# Patient Record
Sex: Female | Born: 1970 | Race: White | Hispanic: No | Marital: Married | Smoking: Former smoker
Health system: Southern US, Community
[De-identification: ages and names within clinical notes are randomized; demographics above are authoritative.]

## PROBLEM LIST (undated history)

## (undated) DIAGNOSIS — F319 Bipolar disorder, unspecified: Secondary | ICD-10-CM

## (undated) HISTORY — PX: TUBAL LIGATION: SHX77

---

## 2015-10-18 ENCOUNTER — Emergency Department (HOSPITAL_BASED_OUTPATIENT_CLINIC_OR_DEPARTMENT_OTHER)
Admission: EM | Admit: 2015-10-18 | Discharge: 2015-10-18 | Disposition: A | Discharge status: Home / Self Care | Payer: PRIVATE HEALTH INSURANCE | Attending: Emergency Medicine | Admitting: Emergency Medicine

## 2015-10-18 ENCOUNTER — Encounter (HOSPITAL_BASED_OUTPATIENT_CLINIC_OR_DEPARTMENT_OTHER): Payer: Self-pay

## 2015-10-18 DIAGNOSIS — N390 Urinary tract infection, site not specified: Secondary | ICD-10-CM | POA: Diagnosis not present

## 2015-10-18 DIAGNOSIS — F17200 Nicotine dependence, unspecified, uncomplicated: Secondary | ICD-10-CM | POA: Insufficient documentation

## 2015-10-18 DIAGNOSIS — R300 Dysuria: Secondary | ICD-10-CM | POA: Diagnosis present

## 2015-10-18 DIAGNOSIS — R110 Nausea: Secondary | ICD-10-CM | POA: Diagnosis not present

## 2015-10-18 DIAGNOSIS — Z79899 Other long term (current) drug therapy: Secondary | ICD-10-CM | POA: Diagnosis not present

## 2015-10-18 DIAGNOSIS — F319 Bipolar disorder, unspecified: Secondary | ICD-10-CM | POA: Insufficient documentation

## 2015-10-18 HISTORY — DX: Bipolar disorder, unspecified: F31.9

## 2015-10-18 LAB — URINALYSIS, ROUTINE W REFLEX MICROSCOPIC
BILIRUBIN URINE: NEGATIVE
GLUCOSE, UA: NEGATIVE mg/dL
KETONES UR: NEGATIVE mg/dL
Nitrite: POSITIVE — AB
Protein, ur: 100 mg/dL — AB
Specific Gravity, Urine: 1.015 (ref 1.005–1.030)
pH: 6 (ref 5.0–8.0)

## 2015-10-18 LAB — URINE MICROSCOPIC-ADD ON

## 2015-10-18 LAB — PREGNANCY, URINE: Preg Test, Ur: NEGATIVE

## 2015-10-18 MED ORDER — PHENAZOPYRIDINE HCL 200 MG PO TABS: 200.0000 mg | ORAL_TABLET | Freq: Three times a day (TID) | ORAL | 0 refills | Status: DC

## 2015-10-18 MED ORDER — CIPROFLOXACIN HCL 250 MG PO TABS: 250.0000 mg | ORAL_TABLET | Freq: Two times a day (BID) | ORAL | 0 refills | Status: DC

## 2015-10-18 NOTE — ED Provider Notes (Signed)
[facility] Note   CSN: [id number] Arrival date & time: 10/18/15  2010  First Provider Contact:  First MD Initiated Contact with Patient 10/18/15 2028     By signing my name below, I, Majel Homer, attest that this documentation has been prepared under the direction and in the presence of non-physician practitioner, Elizabeth Sauer, PA-C. Electronically Signed: Majel Homer, Scribe. 10/18/2015. 9:05 PM.  History   Chief Complaint Chief Complaint  Patient presents with  . Dysuria   HPI Comments: Lori Oneal is a 45 y.o. female who presents to the Emergency Department complaining of gradually worsening, dysuria and left flank pain that began 3 days ago and acutely worsened today.  Pt suspects she has a UTI; she notes she has never had similar symptoms before. Pt also reports suprapubic abdominal pain. She denies fever and vomiting. She states associated nausea and chills. She notes she contacted her PCP this morning about her symptoms; however, she did not hear back from her which prompted her to come to ED today. She states her next appointment is in ~2 months.   The history is provided by the patient. No language interpreter was used.   Past Medical History:  Diagnosis Date  . Bipolar disorder (HCC)     There are no active problems to display for this patient.   Past Surgical History:  Procedure Laterality Date  . TUBAL LIGATION      OB History    No data available       Home Medications    Prior to Admission medications   Medication Sig Start Date End Date Taking? Authorizing Provider  Conj Estrog-Medroxyprogest Ace (PREMPRO PO) Take by mouth.   Yes Historical Provider, MD  Divalproex Sodium (DEPAKOTE PO) Take by mouth.   Yes Historical Provider, MD  ciprofloxacin (CIPRO) 250 MG tablet Take 1 tablet (250 mg total) by mouth every 12 (twelve) hours. 10/18/15   Chase Picket Harriett Azar, PA-C  phenazopyridine (PYRIDIUM) 200 MG tablet Take 1 tablet (200 mg total) by  mouth 3 (three) times daily. 10/18/15   Chase Picket Chadley Dziedzic, PA-C    Family History No family history on file.  Social History Social History  Substance Use Topics  . Smoking status: Current Every Day Smoker  . Smokeless tobacco: Never Used  . Alcohol use No     Allergies   Review of patient's allergies indicates no known allergies.   Review of Systems Review of Systems  Constitutional: Negative for fever.  Gastrointestinal: Positive for abdominal pain and nausea. Negative for vomiting.  Genitourinary: Positive for dysuria and flank pain (left).   Physical Exam Updated Vital Signs BP 115/80 (BP Location: Left Arm)   Pulse 73   Temp 99.2 F (37.3 C) (Oral)   Resp 18   Ht  (1.549 m)   Wt 124 lb (56.2 kg)   SpO2 100%   BMI 23.43 kg/m   Physical Exam  Constitutional: She is oriented to person, place, and time. She appears well-developed and well-nourished.  HENT:  Head: Normocephalic and atraumatic.  Eyes: Conjunctivae are normal. Pupils are equal, round, and reactive to light. Right eye exhibits no discharge. Left eye exhibits no discharge. No scleral icterus.  Neck: Normal range of motion. No JVD present. No tracheal deviation present.  Cardiovascular: Normal rate and regular rhythm.   Pulmonary/Chest: Effort normal and breath sounds normal. No stridor.  Abdominal: There is tenderness.  Suprapubic tenderness  Left CVA tenderness   Neurological: She is alert  and oriented to person, place, and time. Coordination normal.  Psychiatric: She has a normal mood and affect. Her behavior is normal. Judgment and thought content normal.  Nursing note and vitals reviewed.  ED Treatments / Results  Labs (all labs ordered are listed, but only abnormal results are displayed) Labs Reviewed  URINALYSIS, ROUTINE W REFLEX MICROSCOPIC (NOT AT [facility]) - Abnormal; Notable for the following:       Result Value   APPearance CLOUDY (*)    Hgb urine dipstick LARGE (*)    Protein, ur  100 (*)    Nitrite POSITIVE (*)    Leukocytes, UA MODERATE (*)    All other components within normal limits  URINE MICROSCOPIC-ADD ON - Abnormal; Notable for the following:    Squamous Epithelial / LPF 0-5 (*)    Bacteria, UA MANY (*)    All other components within normal limits  URINE CULTURE  PREGNANCY, URINE    EKG  EKG Interpretation None       Radiology No results found.  Procedures Procedures  DIAGNOSTIC STUDIES:  Oxygen Saturation is 100% on RA, normal by my interpretation.    COORDINATION OF CARE:  9:02 PM Discussed treatment plan with pt at bedside and pt agreed to plan.  Medications Ordered in ED Medications - No data to display   Initial Impression / Assessment and Plan / ED Course  I have reviewed the triage vital signs and the nursing notes.  Pertinent labs & imaging results that were available during my care of the patient were reviewed by me and considered in my medical decision making (see chart for details).  Clinical Course   Lori Oneal presents to ED for dysuria and left flank pain. UA shows moderate leuks positive nitrite and too numerous to count white blood cells. She is well appearing, afebrile and hemodynamically stable on exam. She does have some left CVA tenderness. Will treat with Cipro. Informed her of reasons to return to ED including vomiting or fevers. If symptoms are not improved or worsen in 3 days, follow up with PCP or return to ED for recheck. PCP follow up in 1 week. Rx for Pyridium given. Urine sent for culture.  Final Clinical Impressions(s) / ED Diagnoses   Final diagnoses:  UTI (lower urinary tract infection)    New Prescriptions Discharge Medication List as of 10/18/2015  9:24 PM    START taking these medications   Details  ciprofloxacin (CIPRO) 250 MG tablet Take 1 tablet (250 mg total) by mouth every 12 (twelve) hours., Starting Mon 10/18/2015, Print    phenazopyridine (PYRIDIUM) 200 MG tablet Take 1 tablet (200 mg  total) by mouth 3 (three) times daily., Starting Mon 10/18/2015, Print       I personally performed the services described in this documentation, which was scribed in my presence. The recorded information has been reviewed and is accurate.     [facility] Larkin Alfred, PA-C 10/19/15 8003    Arby Barrette, MD 10/19/15 [phone]

## 2015-10-18 NOTE — Discharge Instructions (Signed)
Stay very well hydrated with plenty of water throughout the day. Please take antibiotic until completion. Use pyridium as directed to decrease painful urination but know that a common side effect is to turn your urine a bright orange/red color. This is not a harmful side effect. Follow up with primary care physician in 1 week for recheck of ongoing symptoms. ° °Please seek immediate care if you develop the following: °Your symptoms are no better or worse in 3 days. °There is severe back pain or lower abdominal pain.  °You develop chills.  °You have a fever.  °There is nausea or vomiting.  °There is continued burning or discomfort with urination.  °

## 2015-10-18 NOTE — ED Triage Notes (Signed)
C/o dysuria and left flank pain since Saturday-NAD-steady gait

## 2015-10-21 LAB — URINE CULTURE: Culture: >=100000 — AB

## 2015-10-22 ENCOUNTER — Telehealth (HOSPITAL_BASED_OUTPATIENT_CLINIC_OR_DEPARTMENT_OTHER): Payer: Self-pay | Admitting: *Deleted

## 2015-10-22 NOTE — Telephone Encounter (Signed)
Positive urine culture treated with ciprofloxacin no change needed per Tennis Must PharmD.

## 2016-09-13 ENCOUNTER — Encounter (HOSPITAL_BASED_OUTPATIENT_CLINIC_OR_DEPARTMENT_OTHER): Payer: Self-pay | Admitting: Emergency Medicine

## 2016-09-13 ENCOUNTER — Emergency Department (HOSPITAL_BASED_OUTPATIENT_CLINIC_OR_DEPARTMENT_OTHER): Payer: PRIVATE HEALTH INSURANCE

## 2016-09-13 ENCOUNTER — Inpatient Hospital Stay (HOSPITAL_BASED_OUTPATIENT_CLINIC_OR_DEPARTMENT_OTHER)
Admission: EM | Admit: 2016-09-13 | Discharge: 2016-09-16 | DRG: 690 | Disposition: A | Discharge status: Home / Self Care | Payer: PRIVATE HEALTH INSURANCE | Attending: Internal Medicine | Admitting: Internal Medicine

## 2016-09-13 DIAGNOSIS — N10 Acute tubulo-interstitial nephritis: Secondary | ICD-10-CM | POA: Diagnosis not present

## 2016-09-13 DIAGNOSIS — R1031 Right lower quadrant pain: Secondary | ICD-10-CM | POA: Diagnosis not present

## 2016-09-13 DIAGNOSIS — F319 Bipolar disorder, unspecified: Secondary | ICD-10-CM | POA: Diagnosis present

## 2016-09-13 DIAGNOSIS — E871 Hypo-osmolality and hyponatremia: Secondary | ICD-10-CM | POA: Diagnosis present

## 2016-09-13 DIAGNOSIS — Z833 Family history of diabetes mellitus: Secondary | ICD-10-CM

## 2016-09-13 DIAGNOSIS — F17200 Nicotine dependence, unspecified, uncomplicated: Secondary | ICD-10-CM | POA: Diagnosis present

## 2016-09-13 DIAGNOSIS — B9620 Unspecified Escherichia coli [E. coli] as the cause of diseases classified elsewhere: Secondary | ICD-10-CM | POA: Diagnosis present

## 2016-09-13 DIAGNOSIS — N12 Tubulo-interstitial nephritis, not specified as acute or chronic: Secondary | ICD-10-CM | POA: Diagnosis not present

## 2016-09-13 DIAGNOSIS — R7881 Bacteremia: Secondary | ICD-10-CM | POA: Diagnosis present

## 2016-09-13 DIAGNOSIS — R112 Nausea with vomiting, unspecified: Secondary | ICD-10-CM | POA: Diagnosis present

## 2016-09-13 DIAGNOSIS — E876 Hypokalemia: Secondary | ICD-10-CM | POA: Diagnosis present

## 2016-09-13 DIAGNOSIS — Z825 Family history of asthma and other chronic lower respiratory diseases: Secondary | ICD-10-CM

## 2016-09-13 LAB — COMPREHENSIVE METABOLIC PANEL
ALK PHOS: 52 U/L (ref 38–126)
ALT: 15 U/L (ref 14–54)
ANION GAP: 11 (ref 5–15)
AST: 22 U/L (ref 15–41)
Albumin: 4.1 g/dL (ref 3.5–5.0)
BILIRUBIN TOTAL: 1 mg/dL (ref 0.3–1.2)
BUN: 13 mg/dL (ref 6–20)
CALCIUM: 9.1 mg/dL (ref 8.9–10.3)
CO2: 23 mmol/L (ref 22–32)
CREATININE: 0.83 mg/dL (ref 0.44–1.00)
Chloride: 100 mmol/L — ABNORMAL LOW (ref 101–111)
GFR calc non Af Amer: >60 mL/min (ref >60)
GLUCOSE: 119 mg/dL — AB (ref 65–99)
Potassium: 3.5 mmol/L (ref 3.5–5.1)
SODIUM: 134 mmol/L — AB (ref 135–145)
TOTAL PROTEIN: 8.1 g/dL (ref 6.5–8.1)

## 2016-09-13 LAB — URINALYSIS, ROUTINE W REFLEX MICROSCOPIC
BILIRUBIN URINE: NEGATIVE
GLUCOSE, UA: NEGATIVE mg/dL
KETONES UR: 15 mg/dL — AB
Nitrite: POSITIVE — AB
PH: 7.5 (ref 5.0–8.0)
PROTEIN: NEGATIVE mg/dL
Specific Gravity, Urine: 1.012 (ref 1.005–1.030)

## 2016-09-13 LAB — URINALYSIS, MICROSCOPIC (REFLEX)

## 2016-09-13 LAB — PROTIME-INR
INR: 1.15
PROTHROMBIN TIME: 14.8 s (ref 11.4–15.2)

## 2016-09-13 LAB — CBC WITH DIFFERENTIAL/PLATELET
BASOS PCT: 0 %
Basophils Absolute: 0 10*3/uL (ref 0.0–0.1)
EOS ABS: 0 10*3/uL (ref 0.0–0.7)
EOS PCT: 0 %
HCT: 40.2 % (ref 36.0–46.0)
Hemoglobin: 13.8 g/dL (ref 12.0–15.0)
LYMPHS ABS: 0.6 10*3/uL — AB (ref 0.7–4.0)
Lymphocytes Relative: 5 %
MCH: 29.8 pg (ref 26.0–34.0)
MCHC: 34.3 g/dL (ref 30.0–36.0)
MCV: 86.8 fL (ref 78.0–100.0)
MONOS PCT: 12 %
Monocytes Absolute: 1.5 10*3/uL — ABNORMAL HIGH (ref 0.1–1.0)
NEUTROS PCT: 83 %
Neutro Abs: 9.9 10*3/uL — ABNORMAL HIGH (ref 1.7–7.7)
Platelets: 313 10*3/uL (ref 150–400)
RBC: 4.63 MIL/uL (ref 3.87–5.11)
RDW: 13.7 % (ref 11.5–15.5)
WBC: 11.9 10*3/uL — AB (ref 4.0–10.5)

## 2016-09-13 LAB — I-STAT CG4 LACTIC ACID, ED
LACTIC ACID, VENOUS: 0.43 mmol/L — AB (ref 0.5–1.9)
Lactic Acid, Venous: 1.48 mmol/L (ref 0.5–1.9)

## 2016-09-13 LAB — PREGNANCY, URINE: Preg Test, Ur: NEGATIVE

## 2016-09-13 MED ORDER — ACETAMINOPHEN 650 MG RE SUPP: 650.0000 mg | Freq: Four times a day (QID) | RECTAL | Status: DC | PRN

## 2016-09-13 MED ORDER — SODIUM CHLORIDE 0.9 % IV BOLUS (SEPSIS)
1000.0000 mL | Freq: Once | INTRAVENOUS | Status: AC
  Administered 2016-09-13: 1000 mL via INTRAVENOUS

## 2016-09-13 MED ORDER — ENOXAPARIN SODIUM 40 MG/0.4ML ~~LOC~~ SOLN
40.0000 mg | Freq: Every day | SUBCUTANEOUS | Status: DC
  Administered 2016-09-14 – 2016-09-15 (×2): 40 mg via SUBCUTANEOUS
  Filled 2016-09-13 (×2): qty 0.4

## 2016-09-13 MED ORDER — DEXTROSE 5 % IV SOLN
1.0000 g | Freq: Once | INTRAVENOUS | Status: AC
  Administered 2016-09-13: 1 g via INTRAVENOUS
  Filled 2016-09-13: qty 10

## 2016-09-13 MED ORDER — IBUPROFEN 200 MG PO TABS
400.0000 mg | ORAL_TABLET | Freq: Four times a day (QID) | ORAL | Status: DC | PRN
  Administered 2016-09-14: 600 mg via ORAL
  Filled 2016-09-13: qty 3

## 2016-09-13 MED ORDER — SODIUM CHLORIDE 0.9 % IV BOLUS (SEPSIS)
1000.0000 mL | Freq: Once | INTRAVENOUS | Status: AC
Start: 2016-09-13 — End: 2016-09-13
  Administered 2016-09-13: 1000 mL via INTRAVENOUS

## 2016-09-13 MED ORDER — MORPHINE SULFATE (PF) 2 MG/ML IV SOLN
2.0000 mg | Freq: Once | INTRAVENOUS | Status: AC
  Administered 2016-09-13: 2 mg via INTRAVENOUS
  Filled 2016-09-13: qty 1

## 2016-09-13 MED ORDER — IOPAMIDOL (ISOVUE-300) INJECTION 61%
100.0000 mL | Freq: Once | INTRAVENOUS | Status: AC | PRN
  Administered 2016-09-13: 100 mL via INTRAVENOUS

## 2016-09-13 MED ORDER — OXYCODONE HCL 5 MG PO TABS
5.0000 mg | ORAL_TABLET | Freq: Four times a day (QID) | ORAL | Status: DC | PRN
  Administered 2016-09-14 – 2016-09-15 (×3): 5 mg via ORAL
  Filled 2016-09-13 (×3): qty 1

## 2016-09-13 MED ORDER — DEXTROSE 5 % IV SOLN: 1.0000 g | INTRAVENOUS | Status: DC

## 2016-09-13 MED ORDER — ONDANSETRON HCL 4 MG/2ML IJ SOLN
4.0000 mg | Freq: Once | INTRAMUSCULAR | Status: AC
  Administered 2016-09-13: 4 mg via INTRAVENOUS
  Filled 2016-09-13: qty 2

## 2016-09-13 MED ORDER — ONDANSETRON HCL 4 MG/2ML IJ SOLN
4.0000 mg | Freq: Four times a day (QID) | INTRAMUSCULAR | Status: DC | PRN
  Administered 2016-09-14 (×2): 4 mg via INTRAVENOUS
  Filled 2016-09-13 (×2): qty 2

## 2016-09-13 MED ORDER — PANTOPRAZOLE SODIUM 40 MG PO TBEC
40.0000 mg | DELAYED_RELEASE_TABLET | Freq: Every day | ORAL | Status: DC
  Administered 2016-09-14 – 2016-09-16 (×3): 40 mg via ORAL
  Filled 2016-09-13 (×3): qty 1

## 2016-09-13 MED ORDER — ACETAMINOPHEN 500 MG PO TABS
ORAL_TABLET | ORAL | Status: AC
  Administered 2016-09-13: 1000 mg
  Filled 2016-09-13: qty 2

## 2016-09-13 MED ORDER — ACETAMINOPHEN 325 MG PO TABS
650.0000 mg | ORAL_TABLET | Freq: Four times a day (QID) | ORAL | Status: DC | PRN
  Administered 2016-09-14 – 2016-09-16 (×5): 650 mg via ORAL
  Filled 2016-09-13 (×5): qty 2

## 2016-09-13 MED ORDER — ONDANSETRON HCL 4 MG PO TABS: 4.0000 mg | ORAL_TABLET | Freq: Four times a day (QID) | ORAL | Status: DC | PRN

## 2016-09-13 NOTE — ED Triage Notes (Signed)
Patient states that she thinks she has a UTI  - has had symptoms for 2 weeks. Patient called her MD and he gave her Rx for amoxicillin. Patient is now having back pain, fever, chills and aches

## 2016-09-13 NOTE — Progress Notes (Signed)
Pt. Has arrived to the unit via [facility]. She is alert and oriented. Denies c/o pain is able to walk unassisted and is not in any acute distress. No family @ bedside @ this time.

## 2016-09-13 NOTE — ED Notes (Signed)
ED Provider at bedside. 

## 2016-09-13 NOTE — ED Notes (Signed)
Patient transported to CT 

## 2016-09-13 NOTE — ED Provider Notes (Signed)
[facility]   CSN: [id number] Arrival date & time: 09/13/16  1527     History   Chief Complaint Chief Complaint  Patient presents with  . Back Pain    HPI Lori Oneal is a 46 y.o. female.  HPI   UTI a few weeks ago, took over the counter medications and it came. Felt dysuria, frequency and back pain on the right side all the way across.  Having chills. Was given amoxicillin by PCP but has not been able to keep it down. Started it on Tuesday.  Throwing up at least 3 times per day.  Urinating frequently. No diarrhea.  Lower abdominal pain and right sided pain.  Flank pain is severe.   Past Medical History:  Diagnosis Date  . Bipolar disorder [facility])     Patient Active Problem List   Diagnosis Date Noted  . Acute pyelonephritis 09/13/2016    Past Surgical History:  Procedure Laterality Date  . TUBAL LIGATION      OB History    No data available       Home Medications    Prior to Admission medications   Medication Sig Start Date End Date Taking? Authorizing Provider  ciprofloxacin (CIPRO) 250 MG tablet Take 1 tablet (250 mg total) by mouth every 12 (twelve) hours. 10/18/15   Ward, Chase Picket, PA-C  Conj Estrog-Medroxyprogest Ace (PREMPRO PO) Take by mouth.    [provider]  Divalproex Sodium (DEPAKOTE PO) Take by mouth.    [provider]  phenazopyridine (PYRIDIUM) 200 MG tablet Take 1 tablet (200 mg total) by mouth 3 (three) times daily. 10/18/15   Ward, Chase Picket, PA-C    Family History History reviewed. No pertinent family history.  Social History Social History  Substance Use Topics  . Smoking status: Current Every Day Smoker  . Smokeless tobacco: Never Used  . Alcohol use No     Allergies   Patient has no known allergies.   Review of Systems Review of Systems  Constitutional: Positive for chills, fatigue and fever.  HENT: Negative for sore throat.   Eyes: Negative for visual disturbance.    Respiratory: Negative for cough and shortness of breath.   Cardiovascular: Negative for chest pain.  Gastrointestinal: Positive for abdominal pain (lower abdomen/suprapubic and right), nausea and vomiting. Negative for constipation and diarrhea.  Genitourinary: Positive for dysuria, flank pain, frequency and urgency.  Musculoskeletal: Negative for back pain and neck pain.  Skin: Negative for rash.  Neurological: Negative for syncope and headaches.     Physical Exam Updated Vital Signs BP (!) 102/59   Pulse 66   Temp 98.5 F (36.9 C) (Oral)   Resp 18   Ht 5\' 1"  (1.549 m)   Wt 55.3 kg (122 lb)   SpO2 100%   BMI 23.05 kg/m   Physical Exam  Constitutional: She is oriented to person, place, and time. She appears well-developed and well-nourished. No distress.  HENT:  Head: Normocephalic and atraumatic.  Eyes: Conjunctivae and EOM are normal.  Neck: Normal range of motion.  Cardiovascular: Normal rate, regular rhythm, normal heart sounds and intact distal pulses.  Exam reveals no gallop and no friction rub.   No murmur heard. Pulmonary/Chest: Effort normal and breath sounds normal. No respiratory distress. She has no wheezes. She has no rales.  Abdominal: Soft. She exhibits no distension. There is tenderness in the right upper quadrant, right lower quadrant and suprapubic area. There is CVA tenderness (bilateral) and tenderness  at McBurney's point. There is no guarding and negative Murphy's sign.  Musculoskeletal: She exhibits no edema or tenderness.  Neurological: She is alert and oriented to person, place, and time.  Skin: Skin is warm and dry. No rash noted. She is not diaphoretic. No erythema.  Nursing note and vitals reviewed.    ED Treatments / Results  Labs (all labs ordered are listed, but only abnormal results are displayed) Labs Reviewed  COMPREHENSIVE METABOLIC PANEL - Abnormal; Notable for the following:       Result Value   Sodium 134 (*)    Chloride 100 (*)     Glucose, Bld 119 (*)    All other components within normal limits  CBC WITH DIFFERENTIAL/PLATELET - Abnormal; Notable for the following:    WBC 11.9 (*)    Neutro Abs 9.9 (*)    Lymphs Abs 0.6 (*)    Monocytes Absolute 1.5 (*)    All other components within normal limits  URINALYSIS, ROUTINE W REFLEX MICROSCOPIC - Abnormal; Notable for the following:    APPearance CLOUDY (*)    Hgb urine dipstick TRACE (*)    Ketones, ur 15 (*)    Nitrite POSITIVE (*)    Leukocytes, UA LARGE (*)    All other components within normal limits  URINALYSIS, MICROSCOPIC (REFLEX) - Abnormal; Notable for the following:    Bacteria, UA FEW (*)    Squamous Epithelial / LPF 0-5 (*)    All other components within normal limits  I-STAT CG4 LACTIC ACID, ED - Abnormal; Notable for the following:    Lactic Acid, Venous 0.43 (*)    All other components within normal limits  CULTURE, BLOOD (ROUTINE X 2)  CULTURE, BLOOD (ROUTINE X 2)  URINE CULTURE  PROTIME-INR  PREGNANCY, URINE  I-STAT CG4 LACTIC ACID, ED    EKG  EKG Interpretation None       Radiology Dg Chest 2 View  Result Date: 09/13/2016 CLINICAL DATA:  46 year old female with fever and chills. Possible sepsis. EXAM: CHEST  2 VIEW COMPARISON:  None. FINDINGS: The heart size and mediastinal contours are within normal limits. Both lungs are clear. The visualized skeletal structures are unremarkable. IMPRESSION: No active cardiopulmonary disease. Electronically Signed   By: Elgie CollardArash  Radparvar M.D.   On: 09/13/2016 18:41   Ct Abdomen Pelvis W Contrast  Result Date: 09/13/2016 CLINICAL DATA:  Acute onset of right flank pain and fever. Recently diagnosed with urinary tract infection. Initial encounter. EXAM: CT ABDOMEN AND PELVIS WITH CONTRAST TECHNIQUE: Multidetector CT imaging of the abdomen and pelvis was performed using the standard protocol following bolus administration of intravenous contrast. CONTRAST:  100mL ISOVUE-300 IOPAMIDOL (ISOVUE-300)  INJECTION 61% COMPARISON:  None. FINDINGS: Lower chest: The visualized lung bases are grossly clear. The visualized portions of the mediastinum are unremarkable. Hepatobiliary: The liver is unremarkable in appearance. The gallbladder is unremarkable in appearance. The common bile duct remains normal in caliber. Pancreas: The pancreas is within normal limits. Spleen: The spleen is unremarkable in appearance. Adrenals/Urinary Tract: The adrenal glands are unremarkable in appearance. Multiple foci of decreased enhancement are noted at the periphery of the right kidney, compatible with multifocal right-sided pyelonephritis. Surrounding perinephric stranding is noted. There is wall thickening along the course of the right ureter and at the left mid ureter, compatible with bilateral ureteritis. There is no evidence of significant hydronephrosis. No renal or ureteral stones are identified. A left renal cyst is noted. Stomach/Bowel: The stomach is unremarkable in appearance. The small  bowel is within normal limits. The appendix is normal in caliber, without evidence of appendicitis. The colon is unremarkable in appearance. Vascular/Lymphatic: The abdominal aorta is unremarkable in appearance. The inferior vena cava is grossly unremarkable. No retroperitoneal lymphadenopathy is seen. No pelvic sidewall lymphadenopathy is identified. Reproductive: The bladder is mildly distended and grossly unremarkable. The uterus is unremarkable in appearance. The ovaries are symmetric and grossly unremarkable in appearance. No suspicious adnexal masses are seen. Other: No additional soft tissue abnormalities are seen. Musculoskeletal: No acute osseous abnormalities are identified. The visualized musculature is unremarkable in appearance. IMPRESSION: 1. Multifocal right-sided pyelonephritis noted. Wall thickening along the courses of both ureters, compatible with bilateral ureteritis. 2. Small left renal cyst noted. Electronically Signed    By: Roanna Raider M.D.   On: 09/13/2016 18:27    Procedures Procedures (including critical care time)  Medications Ordered in ED Medications  sodium chloride 0.9 % bolus 1,000 mL (0 mLs Intravenous Stopped 09/13/16 1709)  sodium chloride 0.9 % bolus 1,000 mL (0 mLs Intravenous Stopped 09/13/16 1908)  cefTRIAXone (ROCEPHIN) 1 g in dextrose 5 % 50 mL IVPB (0 g Intravenous Stopped 09/13/16 1708)  ondansetron (ZOFRAN) injection 4 mg (4 mg Intravenous Given 09/13/16 1615)  morphine 2 MG/ML injection 2 mg (2 mg Intravenous Given 09/13/16 1615)  acetaminophen (TYLENOL) 500 MG tablet (1,000 mg  Given 09/13/16 1615)  iopamidol (ISOVUE-300) 61 % injection 100 mL (100 mLs Intravenous Contrast Given 09/13/16 1800)  sodium chloride 0.9 % bolus 1,000 mL (1,000 mLs Intravenous New Bag/Given 09/13/16 1933)  sodium chloride 0.9 % bolus 1,000 mL (1,000 mLs Intravenous New Bag/Given 09/13/16 1933)  ondansetron (ZOFRAN) injection 4 mg (4 mg Intravenous Given 09/13/16 1926)     Initial Impression / Assessment and Plan / ED Course  I have reviewed the triage vital signs and the nursing notes.  Pertinent labs & imaging results that were available during my care of the patient were reviewed by me and considered in my medical decision making (see chart for details).     46 year old female with a history of bipolar disorder presents with concern for nausea, vomiting, fever, urinary symptoms, abdominal and flank pain.  Symptoms concerning for pyelonephritis. Patient febrile and tachypnea, arrival to the emergency department. Blood cultures, lactic acid, 2 L of normal saline were ordered as well as Rocephin for presumed UTI.  Lactic acid returned within normal limits.  Patient with vital signs improved with fluids and decrease in temperature.   CT ordered to evaluate for other complicating factors, rule out abscess, nephrolithiasis, appendicitis or other.  CT shows right sided pyelonephritis, bilateral ureteral  involvement.   Patient able to tolerate po in ED, feels improved, however blood pressures noted to be fluctuating and she has lightheadedness when standing.  BP 90s systolic when I am in room, suspect accurate BP, a few noted in 80s but not sure if these values were accurate.  Will given 2 more L of NS, admit for observation.   Final Clinical Impressions(s) / ED Diagnoses   Final diagnoses:  Pyelonephritis    New Prescriptions New Prescriptions   No medications on file     Alvira Monday, MD 09/13/16 2005

## 2016-09-13 NOTE — H&P (Signed)
History and Physical  Patient Name: Lori Oneal     ZOX:[id number]    DOB: [birth year]    DOA: 09/13/2016 PCP: System, Pcp Not In  Patient coming from: Home --> [facility]  Chief Complaint: Nausea, fever, flank pain      HPI: Lori Oneal is a 46 y.o. female with no significant past medical history who presents with dysuria and fever, flank pain, vomiting.  Patient was in his usual state of health until about 2 weeks ago when she started to have typical UTI symptoms of dysuria and urinary frequency. She called her PCP who did not obtain a culture but did call in Amoxicillin over the phone which she has been taking. Then in the last day or 2 she has had new onset fever, nausea with vomting, right flank pain and malaise so she came to the ER.  ED course: -Afebrile, heart rate 66, respiration and pulse ox normal, BP 102/59 -Na 134, K 3.5, Cr 0.83, WBC 11.9K, Hgb 13.8 -Lactate 1.48 -Urinalysis with pyuria -She was given 4L NS because of her BP in the 90s and 100s and ceftriaxone IV and TRH were asked to observe for pyelonephritis       ROS: Review of Systems  Constitutional: Positive for chills, fever and malaise/fatigue.  Gastrointestinal: Positive for abdominal pain, nausea and vomiting.  Genitourinary: Positive for dysuria, flank pain and frequency.  All other systems reviewed and are negative.         Past Medical History:  Diagnosis Date  . Bipolar disorder [facility](HCC)     Past Surgical History:  Procedure Laterality Date  . TUBAL LIGATION      Social History: Patient lives with her husband.  The patient walks unassisted.  Nonsmoker.  She is a Geneticist, molecularfurniture fabricator [place]  Rare alcohol.  No Known Allergies  Family history: family history includes COPD in her mother; Diabetes in her other.  Prior to Admission medications   Medication Sig Start Date End Date Taking? Authorizing Provider  estrogen, conjugated,-medroxyprogesterone (PREMPRO) 0.625-2.5 MG tablet Take 1  tablet by mouth daily.   Yes [provider]  omeprazole (PRILOSEC) 40 MG capsule Take 40 mg by mouth daily.   Yes [provider]       Physical Exam: BP 93/64 (BP Location: Right Arm)   Pulse 65   Temp 98.3 F (36.8 C) (Oral)   Resp 18   Ht 5\' 1"  (1.549 m)   Wt 55.3 kg (122 lb)   SpO2 97%   BMI 23.05 kg/m  General appearance: Well-developed, adult female, alert and in no acute distress.   Eyes: Anicteric, conjunctiva pink, lids and lashes normal. PERRL.    ENT: No nasal deformity, discharge, epistaxis.  Hearing normal. OP moist without lesions.   Skin: Warm and dry.  No jaundice.  No suspicious rashes or lesions. Cardiac: RRR, nl S1-S2, no murmurs appreciated.  Capillary refill is brisk.  JVP normal.  No LE edema.  Radial and DP pulses 2+ and symmetric. Respiratory: Normal respiratory rate and rhythm.  CTAB without rales or wheezes. Abdomen: Abdomen soft.  Mild R sided TTP without rebound.  Right sided CVA tenderness. No ascites, distension, hepatosplenomegaly.   MSK: No deformities or effusions.  No cyanosis or clubbing. Neuro: Cranial nerves normal.  Sensation intact to light touch. Speech is fluent.  Muscle strength normal.    Psych: Sensorium intact and responding to questions, attention normal.  Behavior appropriate.  Affect normal.  Judgment and insight appear normal.  Labs on Admission:  I have personally reviewed following labs and imaging studies: CBC:  Recent Labs Lab 09/13/16 1545  WBC 11.9*  NEUTROABS 9.9*  HGB 13.8  HCT 40.2  MCV 86.8  PLT 313   Basic Metabolic Panel:  Recent Labs Lab 09/13/16 1545  NA 134*  K 3.5  CL 100*  CO2 23  GLUCOSE 119*  BUN 13  CREATININE 0.83  CALCIUM 9.1   GFR: Estimated Creatinine Clearance: 63.2 mL/min (by C-G formula based on SCr of 0.83 mg/dL).  Liver Function Tests:  Recent Labs Lab 09/13/16 1545  AST 22  ALT 15  ALKPHOS 52  BILITOT 1.0  PROT 8.1  ALBUMIN 4.1   No results for  input(s): LIPASE, AMYLASE in the last 168 hours. No results for input(s): AMMONIA in the last 168 hours. Coagulation Profile:  Recent Labs Lab 09/13/16 1545  INR 1.15   Cardiac Enzymes: No results for input(s): CKTOTAL, CKMB, CKMBINDEX, TROPONINI in the last 168 hours. BNP (last 3 results) No results for input(s): PROBNP in the last 8760 hours. HbA1C: No results for input(s): HGBA1C in the last 72 hours. CBG: No results for input(s): GLUCAP in the last 168 hours. Lipid Profile: No results for input(s): CHOL, HDL, LDLCALC, TRIG, CHOLHDL, LDLDIRECT in the last 72 hours. Thyroid Function Tests: No results for input(s): TSH, T4TOTAL, FREET4, T3FREE, THYROIDAB in the last 72 hours. Anemia Panel: No results for input(s): VITAMINB12, FOLATE, FERRITIN, TIBC, IRON, RETICCTPCT in the last 72 hours. Sepsis Labs: Lactate 1.48 Invalid input(s): PROCALCITONIN, LACTICIDVEN No results found for this or any previous visit (from the past 240 hour(s)).       Radiological Exams on Admission: Personally reviewed CXR shows no opacity; CT report reviewed: Dg Chest 2 View  Result Date: 09/13/2016 CLINICAL DATA:  46 year old female with fever and chills. Possible sepsis. EXAM: CHEST  2 VIEW COMPARISON:  None. FINDINGS: The heart size and mediastinal contours are within normal limits. Both lungs are clear. The visualized skeletal structures are unremarkable. IMPRESSION: No active cardiopulmonary disease. Electronically Signed   By: Elgie Collard M.D.   On: 09/13/2016 18:41   Ct Abdomen Pelvis W Contrast  Result Date: 09/13/2016 CLINICAL DATA:  Acute onset of right flank pain and fever. Recently diagnosed with urinary tract infection. Initial encounter. EXAM: CT ABDOMEN AND PELVIS WITH CONTRAST TECHNIQUE: Multidetector CT imaging of the abdomen and pelvis was performed using the standard protocol following bolus administration of intravenous contrast. CONTRAST:  ISOVUE-300 IOPAMIDOL (ISOVUE-300)  INJECTION 61% COMPARISON:  None. FINDINGS: Lower chest: The visualized lung bases are grossly clear. The visualized portions of the mediastinum are unremarkable. Hepatobiliary: The liver is unremarkable in appearance. The gallbladder is unremarkable in appearance. The common bile duct remains normal in caliber. Pancreas: The pancreas is within normal limits. Spleen: The spleen is unremarkable in appearance. Adrenals/Urinary Tract: The adrenal glands are unremarkable in appearance. Multiple foci of decreased enhancement are noted at the periphery of the right kidney, compatible with multifocal right-sided pyelonephritis. Surrounding perinephric stranding is noted. There is wall thickening along the course of the right ureter and at the left mid ureter, compatible with bilateral ureteritis. There is no evidence of significant hydronephrosis. No renal or ureteral stones are identified. A left renal cyst is noted. Stomach/Bowel: The stomach is unremarkable in appearance. The small bowel is within normal limits. The appendix is normal in caliber, without evidence of appendicitis. The colon is unremarkable in appearance. Vascular/Lymphatic: The abdominal aorta is unremarkable in appearance.  The inferior vena cava is grossly unremarkable. No retroperitoneal lymphadenopathy is seen. No pelvic sidewall lymphadenopathy is identified. Reproductive: The bladder is mildly distended and grossly unremarkable. The uterus is unremarkable in appearance. The ovaries are symmetric and grossly unremarkable in appearance. No suspicious adnexal masses are seen. Other: No additional soft tissue abnormalities are seen. Musculoskeletal: No acute osseous abnormalities are identified. The visualized musculature is unremarkable in appearance. IMPRESSION: 1. Multifocal right-sided pyelonephritis noted. Wall thickening along the courses of both ureters, compatible with bilateral ureteritis. 2. Small left renal cyst noted. Electronically Signed    By: Roanna Raider M.D.   On: 09/13/2016 18:27        Assessment/Plan  1. Acute pyelonephritis:  Suspect hypotension is her baseline.  Has gotten 4L NS already, currently comfortable, mentating well, HR normal.   -IV ceftriaxone -Acetaminophen, ibuprofen, or oxycodone for pain, ondansetron for nausea  2. Hyponatremia:  Mild -Trend         DVT prophylaxis: Lovenox  Code Status: FULL  Family Communication: Husband at bedside  Disposition Plan: Anticipate IV fluids, IV antibiotics and likely home tomrrow if stable Consults called: None Admission status: OBS At the point of initial evaluation, it is my clinical opinion that admission for OBSERVATION is reasonable and necessary because the patient's presenting complaints in the context of their chronic conditions represent sufficient risk of deterioration or significant morbidity to constitute reasonable grounds for close observation in the hospital setting, but that the patient may be medically stable for discharge from the hospital within 24 to 48 hours.    Medical decision making: Patient seen at 10:45 PM on 09/13/2016.  The patient was discussed with Dr. Dalene Seltzer.  What exists of the patient's chart was reviewed in depth and summarized above.  Clinical condition: stable at this time.        Alberteen Sam [facility] Pager [phone]

## 2016-09-13 NOTE — Progress Notes (Signed)
46 yo F otherwise healthy presents with 2 weeks UTI symptoms, had course amoxicilin, now constitutional symptoms, fever, malaise, nausea.  BP (!) 102/59   Pulse 66   Temp 98.5 F (36.9 C) (Oral)   Resp 18   Ht 5\' 1"  (1.549 m)   Wt 55.3 kg (122 lb)   SpO2 100%   BMI 23.05 kg/m   Na 134, K 3.5, Cr 0.83, WBC 11.9K, Hgb 13.8 Lactate 1.48 UA WBCs TNTC Cultures of urine and blood sent Got ceftriaxone  BP baseline 100-110, currently 100s, but dips to 90s.  Patient mentation and feels okay, taking PO, but EDP would like OBS for additional IV antibiotics (last dose CTX at 4pm), IV fluids  To med surg, OBS status

## 2016-09-14 DIAGNOSIS — R112 Nausea with vomiting, unspecified: Secondary | ICD-10-CM | POA: Diagnosis present

## 2016-09-14 DIAGNOSIS — N10 Acute tubulo-interstitial nephritis: Principal | ICD-10-CM

## 2016-09-14 DIAGNOSIS — F319 Bipolar disorder, unspecified: Secondary | ICD-10-CM | POA: Diagnosis present

## 2016-09-14 DIAGNOSIS — R1031 Right lower quadrant pain: Secondary | ICD-10-CM | POA: Diagnosis present

## 2016-09-14 DIAGNOSIS — E871 Hypo-osmolality and hyponatremia: Secondary | ICD-10-CM | POA: Diagnosis present

## 2016-09-14 DIAGNOSIS — Z825 Family history of asthma and other chronic lower respiratory diseases: Secondary | ICD-10-CM | POA: Diagnosis not present

## 2016-09-14 DIAGNOSIS — B9620 Unspecified Escherichia coli [E. coli] as the cause of diseases classified elsewhere: Secondary | ICD-10-CM | POA: Diagnosis present

## 2016-09-14 DIAGNOSIS — E876 Hypokalemia: Secondary | ICD-10-CM | POA: Diagnosis present

## 2016-09-14 DIAGNOSIS — F17200 Nicotine dependence, unspecified, uncomplicated: Secondary | ICD-10-CM | POA: Diagnosis present

## 2016-09-14 DIAGNOSIS — R7881 Bacteremia: Secondary | ICD-10-CM | POA: Diagnosis present

## 2016-09-14 DIAGNOSIS — Z833 Family history of diabetes mellitus: Secondary | ICD-10-CM | POA: Diagnosis not present

## 2016-09-14 LAB — BLOOD CULTURE ID PANEL (REFLEXED)
Acinetobacter baumannii: NOT DETECTED
Candida albicans: NOT DETECTED
Candida glabrata: NOT DETECTED
Candida krusei: NOT DETECTED
Candida parapsilosis: NOT DETECTED
Candida tropicalis: NOT DETECTED
Carbapenem resistance: NOT DETECTED
ENTEROBACTERIACEAE SPECIES: DETECTED — AB
ENTEROCOCCUS SPECIES: NOT DETECTED
Enterobacter cloacae complex: NOT DETECTED
Escherichia coli: DETECTED — AB
HAEMOPHILUS INFLUENZAE: NOT DETECTED
KLEBSIELLA OXYTOCA: NOT DETECTED
Klebsiella pneumoniae: NOT DETECTED
LISTERIA MONOCYTOGENES: NOT DETECTED
NEISSERIA MENINGITIDIS: NOT DETECTED
PSEUDOMONAS AERUGINOSA: NOT DETECTED
Proteus species: NOT DETECTED
STAPHYLOCOCCUS AUREUS BCID: NOT DETECTED
STAPHYLOCOCCUS SPECIES: NOT DETECTED
STREPTOCOCCUS AGALACTIAE: NOT DETECTED
STREPTOCOCCUS PNEUMONIAE: NOT DETECTED
STREPTOCOCCUS PYOGENES: NOT DETECTED
STREPTOCOCCUS SPECIES: NOT DETECTED
Serratia marcescens: NOT DETECTED

## 2016-09-14 LAB — BASIC METABOLIC PANEL
ANION GAP: 8 (ref 5–15)
BUN: 9 mg/dL (ref 6–20)
CALCIUM: 7.8 mg/dL — AB (ref 8.9–10.3)
CHLORIDE: 107 mmol/L (ref 101–111)
CO2: 22 mmol/L (ref 22–32)
Creatinine, Ser: 0.61 mg/dL (ref 0.44–1.00)
GFR calc non Af Amer: >60 mL/min (ref >60)
GLUCOSE: 93 mg/dL (ref 65–99)
Potassium: 3.3 mmol/L — ABNORMAL LOW (ref 3.5–5.1)
Sodium: 137 mmol/L (ref 135–145)

## 2016-09-14 LAB — CBC
HCT: 31.1 % — ABNORMAL LOW (ref 36.0–46.0)
HEMOGLOBIN: 10.5 g/dL — AB (ref 12.0–15.0)
MCH: 29.6 pg (ref 26.0–34.0)
MCHC: 33.8 g/dL (ref 30.0–36.0)
MCV: 87.6 fL (ref 78.0–100.0)
Platelets: 239 10*3/uL (ref 150–400)
RBC: 3.55 MIL/uL — ABNORMAL LOW (ref 3.87–5.11)
RDW: 14 % (ref 11.5–15.5)
WBC: 7.6 10*3/uL (ref 4.0–10.5)

## 2016-09-14 LAB — URINE CULTURE: Culture: NO GROWTH

## 2016-09-14 MED ORDER — SODIUM CHLORIDE 0.9 % IV BOLUS (SEPSIS)
500.0000 mL | Freq: Once | INTRAVENOUS | Status: AC
  Administered 2016-09-14: 500 mL via INTRAVENOUS

## 2016-09-14 MED ORDER — DEXTROSE 5 % IV SOLN
2.0000 g | INTRAVENOUS | Status: DC
  Administered 2016-09-14 – 2016-09-16 (×3): 2 g via INTRAVENOUS
  Filled 2016-09-14 (×3): qty 2

## 2016-09-14 MED ORDER — POTASSIUM CHLORIDE CRYS ER 20 MEQ PO TBCR
40.0000 meq | EXTENDED_RELEASE_TABLET | Freq: Once | ORAL | Status: AC
  Administered 2016-09-14: 40 meq via ORAL
  Filled 2016-09-14: qty 4

## 2016-09-14 MED ORDER — SODIUM CHLORIDE 0.9 % IV SOLN
INTRAVENOUS | Status: DC
  Administered 2016-09-14 – 2016-09-15 (×2): via INTRAVENOUS

## 2016-09-14 NOTE — Progress Notes (Signed)
PHARMACY NOTE:  ANTIMICROBIAL RENAL DOSAGE ADJUSTMENT  Current antimicrobial regimen includes a mismatch between antimicrobial dosage and estimated renal function.  As per policy approved by the Pharmacy & Therapeutics and Medical Executive Committees, the antimicrobial dosage will be adjusted accordingly.  Current antimicrobial dosage: Ceftriaxone 1g IV q24h  Indication: UTI, pyelonephritis  Renal Function: Estimated Creatinine Clearance: 63.2 mL/min (by C-G formula based on SCr of 0.83 mg/dL).    Antimicrobial dosage has been changed to:  Ceftriaxone 2g IV q24h   Additional comments:   Thank you for allowing pharmacy to be a part of this patient's care.   Lynann Beaverhristine Veleta Yamamoto PharmD, BCPS Pager [phone] 09/14/2016 8:34 AM

## 2016-09-14 NOTE — Progress Notes (Signed)
PHARMACY - PHYSICIAN COMMUNICATION CRITICAL VALUE ALERT - BLOOD CULTURE IDENTIFICATION (BCID)  Results for orders placed or performed during the hospital encounter of 09/13/16  Blood Culture ID Panel (Reflexed) (Collected: 09/13/2016  3:45 PM)  Result Value Ref Range   Enterococcus species NOT DETECTED NOT DETECTED   Listeria monocytogenes NOT DETECTED NOT DETECTED   Staphylococcus species NOT DETECTED NOT DETECTED   Staphylococcus aureus NOT DETECTED NOT DETECTED   Streptococcus species NOT DETECTED NOT DETECTED   Streptococcus agalactiae NOT DETECTED NOT DETECTED   Streptococcus pneumoniae NOT DETECTED NOT DETECTED   Streptococcus pyogenes NOT DETECTED NOT DETECTED   Acinetobacter baumannii NOT DETECTED NOT DETECTED   Enterobacteriaceae species DETECTED (A) NOT DETECTED   Enterobacter cloacae complex NOT DETECTED NOT DETECTED   Escherichia coli DETECTED (A) NOT DETECTED   Klebsiella oxytoca NOT DETECTED NOT DETECTED   Klebsiella pneumoniae NOT DETECTED NOT DETECTED   Proteus species NOT DETECTED NOT DETECTED   Serratia marcescens NOT DETECTED NOT DETECTED   Carbapenem resistance NOT DETECTED NOT DETECTED   Haemophilus influenzae NOT DETECTED NOT DETECTED   Neisseria meningitidis NOT DETECTED NOT DETECTED   Pseudomonas aeruginosa NOT DETECTED NOT DETECTED   Candida albicans NOT DETECTED NOT DETECTED   Candida glabrata NOT DETECTED NOT DETECTED   Candida krusei NOT DETECTED NOT DETECTED   Candida parapsilosis NOT DETECTED NOT DETECTED   Candida tropicalis NOT DETECTED NOT DETECTED    Name of physician (or Provider) Contacted: Dr. Hanley BenAlekh  Changes to prescribed antibiotics required: continue ceftriaxone 2 gm IV q24h  Lucia Gaskinsham, Leyton Brownlee P 09/14/2016  4:57 PM

## 2016-09-14 NOTE — Care Management Note (Signed)
Case Management Note  Patient Details  Name: Lori Oneal MRN: [id number] Date of Birth: 1970/12/21  Subjective/Objective:                   Nausea, fever, flank pain  Action/Plan: Date:  September 14, 2016 Chart reviewed for concurrent status and case management needs. Will continue to follow patient progress. Discharge Planning: following for needs Expected discharge date: [id number] Marcelle SmilingRhonda Davis, BSN, [place]3, [place]   [phone]  Expected Discharge Date:                  Expected Discharge Plan:  Home/Self Care  In-House Referral:     Discharge planning Services  CM Consult  Post Acute Care Choice:    Choice offered to:     DME Arranged:    DME Agency:     HH Arranged:    HH Agency:     Status of Service:  In process, will continue to follow  If discussed at Long Length of Stay Meetings, dates discussed:    Additional Comments:  Golda AcreDavis, Rhonda Lynn, RN 09/14/2016, 9:55 AM

## 2016-09-14 NOTE — Progress Notes (Signed)
Patient ID: Lori Oneal, female   DOB: [birth year], 46 y.o.   MRN: [id number]  PROGRESS NOTE    Lori Oneal  [id number] DOB: [birth year] DOA: 09/13/2016 PCP: System, Pcp Not In   Brief Narrative:  46 year old female with no significant past medical history presented with dysuria, fever, flank pain and vomiting. She was admitted with acute right-sided pyelonephritis and started on intravenous antibiotics.  Assessment & Plan:   Principal Problem:   Acute pyelonephritis Active Problems:   Hyponatremia  1. Acute pyelonephritis:  - Probably bacterial most likely gram-negative bacteria. Increase Rocephin to 2 g IV daily. Follow-up cultures. -Acetaminophen, ibuprofen, or oxycodone for pain, ondansetron for nausea - If condition does not improve, we will get urology evaluation  2. Hyponatremia:  Mild -Improved  3. Leukocytosis: Resolved  4. Hypokalemia : Replace and repeat a.m. labs     DVT prophylaxis: Lovenox Code Status:  Full code Family Communication: None present at bedside Disposition Plan: Home in 1-2 days  Consultants: None  Procedures: None  Antimicrobials: Rocephin from 09/13/2016  Subjective: Patient seen and examined at bedside. She still spiking fevers. Complains of mild nausea and right flank pain. No diarrhea, chest pain  Objective: Vitals:   09/13/16 2140 09/14/16 0527 09/14/16 0648 09/14/16 0800  BP: 93/64 110/68    Pulse: 65 86    Resp: 18 18    Temp: 98.3 F (36.8 C) (!) 102.6 F (39.2 C) (!) 100.8 F (38.2 C) 98.7 F (37.1 C)  TempSrc: Oral Oral Oral Oral  SpO2: 97% 98%    Weight:      Height:        Intake/Output Summary (Last 24 hours) at 09/14/16 1211 Last data filed at 09/13/16 2045  Gross per 24 hour  Intake             5050 ml  Output                0 ml  Net             5050 ml   Filed Weights   09/13/16 1531  Weight: 55.3 kg (122 lb)    Examination:  General exam: Appears calm and comfortable  Respiratory  system: Bilateral decreased breath sound at bases Cardiovascular system: S1 & S2 heard, RRR.  Gastrointestinal system: Abdomen is nondistended, soft and Mildly tender over the right flank region. Normal bowel sounds heard. Extremities: No cyanosis, clubbing, edema   Data Reviewed: I have personally reviewed following labs and imaging studies  CBC:  Recent Labs Lab 09/13/16 1545 09/14/16 0705  WBC 11.9* 7.6  NEUTROABS 9.9*  --   HGB 13.8 10.5*  HCT 40.2 31.1*  MCV 86.8 87.6  PLT 313 239   Basic Metabolic Panel:  Recent Labs Lab 09/13/16 1545 09/14/16 0705  NA 134* 137  K 3.5 3.3*  CL 100* 107  CO2 23 22  GLUCOSE 119* 93  BUN 13 9  CREATININE 0.83 0.61  CALCIUM 9.1 7.8*   GFR: Estimated Creatinine Clearance: 65.6 mL/min (by C-G formula based on SCr of 0.61 mg/dL). Liver Function Tests:  Recent Labs Lab 09/13/16 1545  AST 22  ALT 15  ALKPHOS 52  BILITOT 1.0  PROT 8.1  ALBUMIN 4.1   No results for input(s): LIPASE, AMYLASE in the last 168 hours. No results for input(s): AMMONIA in the last 168 hours. Coagulation Profile:  Recent Labs Lab 09/13/16 1545  INR 1.15   Cardiac Enzymes: No results for input(s): CKTOTAL, CKMB, CKMBINDEX,  TROPONINI in the last 168 hours. BNP (last 3 results) No results for input(s): PROBNP in the last 8760 hours. HbA1C: No results for input(s): HGBA1C in the last 72 hours. CBG: No results for input(s): GLUCAP in the last 168 hours. Lipid Profile: No results for input(s): CHOL, HDL, LDLCALC, TRIG, CHOLHDL, LDLDIRECT in the last 72 hours. Thyroid Function Tests: No results for input(s): TSH, T4TOTAL, FREET4, T3FREE, THYROIDAB in the last 72 hours. Anemia Panel: No results for input(s): VITAMINB12, FOLATE, FERRITIN, TIBC, IRON, RETICCTPCT in the last 72 hours. Sepsis Labs:  Recent Labs Lab 09/13/16 1550 09/13/16 1956  LATICACIDVEN 1.48 0.43*    No results found for this or any previous visit (from the past 240  hour(s)).       Radiology Studies: Dg Chest 2 View  Result Date: 09/13/2016 CLINICAL DATA:  46 year old female with fever and chills. Possible sepsis. EXAM: CHEST  2 VIEW COMPARISON:  None. FINDINGS: The heart size and mediastinal contours are within normal limits. Both lungs are clear. The visualized skeletal structures are unremarkable. IMPRESSION: No active cardiopulmonary disease. Electronically Signed   By: Elgie Collard M.D.   On: 09/13/2016 18:41   Ct Abdomen Pelvis W Contrast  Result Date: 09/13/2016 CLINICAL DATA:  Acute onset of right flank pain and fever. Recently diagnosed with urinary tract infection. Initial encounter. EXAM: CT ABDOMEN AND PELVIS WITH CONTRAST TECHNIQUE: Multidetector CT imaging of the abdomen and pelvis was performed using the standard protocol following bolus administration of intravenous contrast. CONTRAST:  ISOVUE-300 IOPAMIDOL (ISOVUE-300) INJECTION 61% COMPARISON:  None. FINDINGS: Lower chest: The visualized lung bases are grossly clear. The visualized portions of the mediastinum are unremarkable. Hepatobiliary: The liver is unremarkable in appearance. The gallbladder is unremarkable in appearance. The common bile duct remains normal in caliber. Pancreas: The pancreas is within normal limits. Spleen: The spleen is unremarkable in appearance. Adrenals/Urinary Tract: The adrenal glands are unremarkable in appearance. Multiple foci of decreased enhancement are noted at the periphery of the right kidney, compatible with multifocal right-sided pyelonephritis. Surrounding perinephric stranding is noted. There is wall thickening along the course of the right ureter and at the left mid ureter, compatible with bilateral ureteritis. There is no evidence of significant hydronephrosis. No renal or ureteral stones are identified. A left renal cyst is noted. Stomach/Bowel: The stomach is unremarkable in appearance. The small bowel is within normal limits. The appendix is  normal in caliber, without evidence of appendicitis. The colon is unremarkable in appearance. Vascular/Lymphatic: The abdominal aorta is unremarkable in appearance. The inferior vena cava is grossly unremarkable. No retroperitoneal lymphadenopathy is seen. No pelvic sidewall lymphadenopathy is identified. Reproductive: The bladder is mildly distended and grossly unremarkable. The uterus is unremarkable in appearance. The ovaries are symmetric and grossly unremarkable in appearance. No suspicious adnexal masses are seen. Other: No additional soft tissue abnormalities are seen. Musculoskeletal: No acute osseous abnormalities are identified. The visualized musculature is unremarkable in appearance. IMPRESSION: 1. Multifocal right-sided pyelonephritis noted. Wall thickening along the courses of both ureters, compatible with bilateral ureteritis. 2. Small left renal cyst noted. Electronically Signed   By: Roanna Raider M.D.   On: 09/13/2016 18:27        Scheduled Meds: . enoxaparin (LOVENOX) injection  40 mg Subcutaneous QHS  . pantoprazole  40 mg Oral Daily   Continuous Infusions: . cefTRIAXone (ROCEPHIN)  IV 2 g (09/14/16 1106)     LOS: 0 days        Glade Lloyd, MD [facility] Pager [phone]  If 7PM-7AM, please contact night-coverage [email or web address] Password [facility] 09/14/2016, 12:11 PM

## 2016-09-15 LAB — BASIC METABOLIC PANEL
ANION GAP: 7 (ref 5–15)
BUN: 7 mg/dL (ref 6–20)
CALCIUM: 7.9 mg/dL — AB (ref 8.9–10.3)
CO2: 23 mmol/L (ref 22–32)
CREATININE: 0.56 mg/dL (ref 0.44–1.00)
Chloride: 106 mmol/L (ref 101–111)
GLUCOSE: 98 mg/dL (ref 65–99)
Potassium: 3.5 mmol/L (ref 3.5–5.1)
Sodium: 136 mmol/L (ref 135–145)

## 2016-09-15 LAB — CBC WITH DIFFERENTIAL/PLATELET
BASOS ABS: 0 10*3/uL (ref 0.0–0.1)
BASOS PCT: 0 %
EOS PCT: 0 %
Eosinophils Absolute: 0 10*3/uL (ref 0.0–0.7)
HEMATOCRIT: 30.8 % — AB (ref 36.0–46.0)
Hemoglobin: 10.5 g/dL — ABNORMAL LOW (ref 12.0–15.0)
Lymphocytes Relative: 15 %
Lymphs Abs: 0.9 10*3/uL (ref 0.7–4.0)
MCH: 29.5 pg (ref 26.0–34.0)
MCHC: 34.1 g/dL (ref 30.0–36.0)
MCV: 86.5 fL (ref 78.0–100.0)
MONOS PCT: 13 %
Monocytes Absolute: 0.7 10*3/uL (ref 0.1–1.0)
NEUTROS ABS: 4.3 10*3/uL (ref 1.7–7.7)
Neutrophils Relative %: 72 %
Platelets: 218 10*3/uL (ref 150–400)
RBC: 3.56 MIL/uL — ABNORMAL LOW (ref 3.87–5.11)
RDW: 13.8 % (ref 11.5–15.5)
WBC: 5.9 10*3/uL (ref 4.0–10.5)

## 2016-09-15 LAB — HIV ANTIBODY (ROUTINE TESTING W REFLEX): HIV Screen 4th Generation wRfx: NONREACTIVE

## 2016-09-15 LAB — MAGNESIUM: Magnesium: 1.7 mg/dL (ref 1.7–2.4)

## 2016-09-15 MED ORDER — POLYETHYLENE GLYCOL 3350 17 G PO PACK: 17.0000 g | PACK | Freq: Every day | ORAL | Status: DC | PRN

## 2016-09-15 NOTE — Progress Notes (Signed)
Patient ID: Lori Oneal, female   DOB: 06-Dec-1970, 46 y.o.   MRN: [id number]  PROGRESS NOTE    Lori Oneal  JXB:[id number] DOB: 06-Dec-1970 DOA: 09/13/2016 PCP: System, Pcp Not In   Brief Narrative:  46 year old female with no significant past medical history presented with dysuria, fever, flank pain and vomiting. She was admitted with acute right-sided pyelonephritis and started on intravenous antibiotics.   Assessment & Plan:   Principal Problem:   Acute pyelonephritis Active Problems:   Hyponatremia   1. Acute pyelonephritis: - Probably from Escherichia coli. Continue Rocephin 2 g IV daily. Patient is still having low-grade temperatures. -Acetaminophen, ibuprofen, or oxycodone for pain, ondansetron for nausea - If condition does not improve, we will get urology evaluation  2. Escherichia coli bacteremia:  - Probably from pyelonephritis. Continue with Rocephin. Awaiting sensitivities  2. Hyponatremia: Mild -Improved  3. Leukocytosis: Resolved  4. Hypokalemia : Improved    DVT prophylaxis: Lovenox Code Status:  Full code Family Communication: None present at bedside Disposition Plan: Home in 1-2 days  Consultants: None  Procedures: None  Antimicrobials: Rocephin from 09/13/2016  Subjective: Patient seen and examined at bedside. She feels much better. She denies current nausea, diarrhea, chest pain. She had low-grade temperatures this morning  Objective: Vitals:   09/14/16 0800 09/14/16 1300 09/14/16 2102 09/15/16 0544  BP:  (!) 92/54 (!) 91/57 106/76  Pulse:  67 (!) 59 69  Resp:  16 18 18   Temp: 98.7 F (37.1 C) 98.9 F (37.2 C) 99.3 F (37.4 C) (!) 100.4 F (38 C)  TempSrc: Oral Oral Oral Oral  SpO2:  98% 99% 96%  Weight:      Height:        Intake/Output Summary (Last 24 hours) at 09/15/16 1327 Last data filed at 09/15/16 0902  Gross per 24 hour  Intake              890 ml  Output                0 ml  Net              890 ml    Filed Weights   09/13/16 1531  Weight: 55.3 kg (122 lb)    Examination:  General exam: Appears calm and comfortable  Respiratory system: Bilateral decreased breath sound at bases Cardiovascular system: S1 & S2 heard, RRR.   Gastrointestinal system: Abdomen is nondistended, soft and Mildly tender over the right flank region. Normal bowel sounds heard. Extremities: No cyanosis, clubbing, edema     Data Reviewed: I have personally reviewed following labs and imaging studies  CBC:  Recent Labs Lab 09/13/16 1545 09/14/16 0705 09/15/16 0648  WBC 11.9* 7.6 5.9  NEUTROABS 9.9*  --  4.3  HGB 13.8 10.5* 10.5*  HCT 40.2 31.1* 30.8*  MCV 86.8 87.6 86.5  PLT 313 239 218   Basic Metabolic Panel:  Recent Labs Lab 09/13/16 1545 09/14/16 0705 09/15/16 0648  NA 134* 137 136  K 3.5 3.3* 3.5  CL 100* 107 106  CO2 23 22 23   GLUCOSE 119* 93 98  BUN 13 9 7   CREATININE 0.83 0.61 0.56  CALCIUM 9.1 7.8* 7.9*  MG  --   --  1.7   GFR: Estimated Creatinine Clearance: 65.6 mL/min (by C-G formula based on SCr of 0.56 mg/dL). Liver Function Tests:  Recent Labs Lab 09/13/16 1545  AST 22  ALT 15  ALKPHOS 52  BILITOT 1.0  PROT 8.1  ALBUMIN  4.1   No results for input(s): LIPASE, AMYLASE in the last 168 hours. No results for input(s): AMMONIA in the last 168 hours. Coagulation Profile:  Recent Labs Lab 09/13/16 1545  INR 1.15   Cardiac Enzymes: No results for input(s): CKTOTAL, CKMB, CKMBINDEX, TROPONINI in the last 168 hours. BNP (last 3 results) No results for input(s): PROBNP in the last 8760 hours. HbA1C: No results for input(s): HGBA1C in the last 72 hours. CBG: No results for input(s): GLUCAP in the last 168 hours. Lipid Profile: No results for input(s): CHOL, HDL, LDLCALC, TRIG, CHOLHDL, LDLDIRECT in the last 72 hours. Thyroid Function Tests: No results for input(s): TSH, T4TOTAL, FREET4, T3FREE, THYROIDAB in the last 72 hours. Anemia Panel: No results for  input(s): VITAMINB12, FOLATE, FERRITIN, TIBC, IRON, RETICCTPCT in the last 72 hours. Sepsis Labs:  Recent Labs Lab 09/13/16 1550 09/13/16 1956  LATICACIDVEN 1.48 0.43*    Recent Results (from the past 240 hour(s))  Culture, blood (Routine x 2)     Status: Abnormal (Preliminary result)   Collection Time: 09/13/16  3:45 PM  Result Value Ref Range Status   Specimen Description BLOOD LEFT ANTECUBITAL  Final   Special Requests   Final    BOTTLES DRAWN AEROBIC AND ANAEROBIC Blood Culture results may not be optimal due to an inadequate volume of blood received in culture bottles   Culture  Setup Time   Final    GRAM NEGATIVE RODS AEROBIC BOTTLE ONLY CRITICAL RESULT CALLED TO, READ BACK BY AND VERIFIED WITH: PHARMD A. PHAM 1641 09/14/16 T.TYSOR    Culture (A)  Final    ESCHERICHIA COLI SUSCEPTIBILITIES TO FOLLOW Performed at [facility], [address] [place], [place] [address]    Report Status PENDING  Incomplete  Blood Culture ID Panel (Reflexed)     Status: Abnormal   Collection Time: 09/13/16  3:45 PM  Result Value Ref Range Status   Enterococcus species NOT DETECTED NOT DETECTED Final   Listeria monocytogenes NOT DETECTED NOT DETECTED Final   Staphylococcus species NOT DETECTED NOT DETECTED Final   Staphylococcus aureus NOT DETECTED NOT DETECTED Final   Streptococcus species NOT DETECTED NOT DETECTED Final   Streptococcus agalactiae NOT DETECTED NOT DETECTED Final   Streptococcus pneumoniae NOT DETECTED NOT DETECTED Final   Streptococcus pyogenes NOT DETECTED NOT DETECTED Final   Acinetobacter baumannii NOT DETECTED NOT DETECTED Final   Enterobacteriaceae species DETECTED (A) NOT DETECTED Final    Comment: Enterobacteriaceae represent a large family of gram-negative bacteria, not a single organism. CRITICAL RESULT CALLED TO, READ BACK BY AND VERIFIED WITH: PHARMD A. PHAM 1641 09/14/16 T.TYSOR    Enterobacter cloacae complex NOT DETECTED NOT DETECTED Final    Escherichia coli DETECTED (A) NOT DETECTED Final    Comment: CRITICAL RESULT CALLED TO, READ BACK BY AND VERIFIED WITH: PHARMD A. PHAM 1641 09/14/16 T.TYSOR    Klebsiella oxytoca NOT DETECTED NOT DETECTED Final   Klebsiella pneumoniae NOT DETECTED NOT DETECTED Final   Proteus species NOT DETECTED NOT DETECTED Final   Serratia marcescens NOT DETECTED NOT DETECTED Final   Carbapenem resistance NOT DETECTED NOT DETECTED Final   Haemophilus influenzae NOT DETECTED NOT DETECTED Final   Neisseria meningitidis NOT DETECTED NOT DETECTED Final   Pseudomonas aeruginosa NOT DETECTED NOT DETECTED Final   Candida albicans NOT DETECTED NOT DETECTED Final   Candida glabrata NOT DETECTED NOT DETECTED Final   Candida krusei NOT DETECTED NOT DETECTED Final   Candida parapsilosis NOT DETECTED NOT  DETECTED Final   Candida tropicalis NOT DETECTED NOT DETECTED Final  Culture, blood (Routine x 2)     Status: None (Preliminary result)   Collection Time: 09/13/16  4:05 PM  Result Value Ref Range Status   Specimen Description BLOOD RIGHT ANTECUBITAL  Final   Special Requests   Final    BOTTLES DRAWN AEROBIC AND ANAEROBIC Blood Culture results may not be optimal due to an inadequate volume of blood received in culture bottles   Culture   Final    NO GROWTH 2 DAYS Performed at [facility], [address], [place], [place] [address]    Report Status PENDING  Incomplete  Urine culture     Status: None   Collection Time: 09/13/16  5:34 PM  Result Value Ref Range Status   Specimen Description URINE, RANDOM  Final   Special Requests NONE  Final   Culture   Final    NO GROWTH Performed at [facility], [address], [place], [place] [address]    Report Status 09/14/2016 FINAL  Final         Radiology Studies: Dg Chest 2 View  Result Date: 09/13/2016 CLINICAL DATA:  46 year old female with fever and chills. Possible sepsis. EXAM: CHEST  2 VIEW COMPARISON:  None. FINDINGS: The heart size and  mediastinal contours are within normal limits. Both lungs are clear. The visualized skeletal structures are unremarkable. IMPRESSION: No active cardiopulmonary disease. Electronically Signed   By: Elgie Collard M.D.   On: 09/13/2016 18:41   Ct Abdomen Pelvis W Contrast  Result Date: 09/13/2016 CLINICAL DATA:  Acute onset of right flank pain and fever. Recently diagnosed with urinary tract infection. Initial encounter. EXAM: CT ABDOMEN AND PELVIS WITH CONTRAST TECHNIQUE: Multidetector CT imaging of the abdomen and pelvis was performed using the standard protocol following bolus administration of intravenous contrast. CONTRAST:  ISOVUE-300 IOPAMIDOL (ISOVUE-300) INJECTION 61% COMPARISON:  None. FINDINGS: Lower chest: The visualized lung bases are grossly clear. The visualized portions of the mediastinum are unremarkable. Hepatobiliary: The liver is unremarkable in appearance. The gallbladder is unremarkable in appearance. The common bile duct remains normal in caliber. Pancreas: The pancreas is within normal limits. Spleen: The spleen is unremarkable in appearance. Adrenals/Urinary Tract: The adrenal glands are unremarkable in appearance. Multiple foci of decreased enhancement are noted at the periphery of the right kidney, compatible with multifocal right-sided pyelonephritis. Surrounding perinephric stranding is noted. There is wall thickening along the course of the right ureter and at the left mid ureter, compatible with bilateral ureteritis. There is no evidence of significant hydronephrosis. No renal or ureteral stones are identified. A left renal cyst is noted. Stomach/Bowel: The stomach is unremarkable in appearance. The small bowel is within normal limits. The appendix is normal in caliber, without evidence of appendicitis. The colon is unremarkable in appearance. Vascular/Lymphatic: The abdominal aorta is unremarkable in appearance. The inferior vena cava is grossly unremarkable. No  retroperitoneal lymphadenopathy is seen. No pelvic sidewall lymphadenopathy is identified. Reproductive: The bladder is mildly distended and grossly unremarkable. The uterus is unremarkable in appearance. The ovaries are symmetric and grossly unremarkable in appearance. No suspicious adnexal masses are seen. Other: No additional soft tissue abnormalities are seen. Musculoskeletal: No acute osseous abnormalities are identified. The visualized musculature is unremarkable in appearance. IMPRESSION: 1. Multifocal right-sided pyelonephritis noted. Wall thickening along the courses of both ureters, compatible with bilateral ureteritis. 2. Small left renal cyst noted. Electronically Signed   By: Leotis Shames  Chang M.D.   On: 09/13/2016 18:27        Scheduled Meds: . enoxaparin (LOVENOX) injection  40 mg Subcutaneous QHS  . pantoprazole  40 mg Oral Daily   Continuous Infusions: . sodium chloride 100 mL/hr at 09/15/16 0853  . cefTRIAXone (ROCEPHIN)  IV 2 g (09/15/16 1155)     LOS: 1 day        Glade Lloyd, MD [facility] Pager [phone]  If 7PM-7AM, please contact night-coverage [email or web address] Password [email or web address] 09/15/2016, 1:27 PM

## 2016-09-16 LAB — BASIC METABOLIC PANEL
ANION GAP: 7 (ref 5–15)
BUN: 13 mg/dL (ref 6–20)
CALCIUM: 8.5 mg/dL — AB (ref 8.9–10.3)
CO2: 29 mmol/L (ref 22–32)
Chloride: 104 mmol/L (ref 101–111)
Creatinine, Ser: 0.63 mg/dL (ref 0.44–1.00)
GFR calc Af Amer: >60 mL/min (ref >60)
GFR calc non Af Amer: >60 mL/min (ref >60)
GLUCOSE: 111 mg/dL — AB (ref 65–99)
Potassium: 3.8 mmol/L (ref 3.5–5.1)
Sodium: 140 mmol/L (ref 135–145)

## 2016-09-16 LAB — CULTURE, BLOOD (ROUTINE X 2)

## 2016-09-16 LAB — CBC WITH DIFFERENTIAL/PLATELET
BASOS ABS: 0 10*3/uL (ref 0.0–0.1)
Basophils Relative: 0 %
Eosinophils Absolute: 0.1 10*3/uL (ref 0.0–0.7)
Eosinophils Relative: 1 %
HEMATOCRIT: 33.3 % — AB (ref 36.0–46.0)
Hemoglobin: 11 g/dL — ABNORMAL LOW (ref 12.0–15.0)
LYMPHS PCT: 14 %
Lymphs Abs: 0.8 10*3/uL (ref 0.7–4.0)
MCH: 28.7 pg (ref 26.0–34.0)
MCHC: 33 g/dL (ref 30.0–36.0)
MCV: 86.9 fL (ref 78.0–100.0)
MONO ABS: 0.9 10*3/uL (ref 0.1–1.0)
MONOS PCT: 16 %
NEUTROS ABS: 4.2 10*3/uL (ref 1.7–7.7)
Neutrophils Relative %: 69 %
Platelets: 267 10*3/uL (ref 150–400)
RBC: 3.83 MIL/uL — ABNORMAL LOW (ref 3.87–5.11)
RDW: 13.8 % (ref 11.5–15.5)
WBC: 6 10*3/uL (ref 4.0–10.5)

## 2016-09-16 LAB — MAGNESIUM: Magnesium: 1.9 mg/dL (ref 1.7–2.4)

## 2016-09-16 MED ORDER — ONDANSETRON HCL 4 MG PO TABS: 4.0000 mg | ORAL_TABLET | Freq: Four times a day (QID) | ORAL | 0 refills | Status: DC | PRN

## 2016-09-16 MED ORDER — CIPROFLOXACIN HCL 500 MG PO TABS: 500.0000 mg | ORAL_TABLET | Freq: Two times a day (BID) | ORAL | 0 refills | Status: AC

## 2016-09-16 MED ORDER — TRAMADOL HCL 50 MG PO TABS: 50.0000 mg | ORAL_TABLET | Freq: Four times a day (QID) | ORAL | 0 refills | Status: DC | PRN

## 2016-09-16 NOTE — Discharge Summary (Signed)
Physician Discharge Summary  Lori Oneal WUJ:[id number] DOB: 1970-11-04 DOA: 09/13/2016  PCP: System, Pcp Not In  Admit date: 09/13/2016 Discharge date: 09/16/2016  Admitted From: Home  Disposition:  Home  Recommendations for Outpatient Follow-up:  1. Follow up with PCP in 1 week 2. Please obtain BMP/CBC in one week   Home Health: No  Equipment/Devices: None  Discharge Condition: Stable CODE STATUS: Full Diet recommendation:  Regular   Brief/Interim Summary: 46 year old female with no significant past medical history presented with dysuria, fever, flank pain and vomiting. She was admitted with acute right-sided pyelonephritis and started on intravenous antibiotics. Blood cultures were positive for Escherichia coli. She still having low-grade temperatures but wants to go home today.  Discharge Diagnoses:  Principal Problem:   Acute pyelonephritis Active Problems:   Hyponatremia   1. Acute pyelonephritis: - Probably from Escherichia coli. Currently on Rocephin 2 g IV daily. Patient is still having low-grade temperatures. Sensitivities are available. Patient wants to go home today. Discharge home on oral ciprofloxacin 500 mg twice a day to complete a two-week course of therapy. If symptoms get worse including worsening abdominal pain, fever, vomiting; patient was advised to return to the emergency room or call her primary care provider. -Follow-up with primary care provider in a week  2. Escherichia coli bacteremia:  - Probably from pyelonephritis. Discharge home on oral Cipro   2. Hyponatremia: Mild -Improved  3. Leukocytosis: Resolved  4. Hypokalemia : Improved  Discharge Instructions  Discharge Instructions    Call MD for:  difficulty breathing, headache or visual disturbances    Complete by:  As directed    Call MD for:  hives    Complete by:  As directed    Call MD for:  persistant nausea and vomiting    Complete by:  As directed    Call MD for:  severe  uncontrolled pain    Complete by:  As directed    Call MD for:  temperature >100.4    Complete by:  As directed    Diet general    Complete by:  As directed    Increase activity slowly    Complete by:  As directed      Allergies as of 09/16/2016   No Known Allergies     Medication List    TAKE these medications   ciprofloxacin 500 MG tablet Commonly known as:  CIPRO Take 1 tablet (500 mg total) by mouth 2 (two) times daily.   estrogen (conjugated)-medroxyprogesterone 0.625-2.5 MG tablet Commonly known as:  PREMPRO Take 1 tablet by mouth daily.   omeprazole 40 MG capsule Commonly known as:  PRILOSEC Take 40 mg by mouth daily.   ondansetron 4 MG tablet Commonly known as:  ZOFRAN Take 1 tablet (4 mg total) by mouth every 6 (six) hours as needed for nausea.   traMADol 50 MG tablet Commonly known as:  ULTRAM Take 1 tablet (50 mg total) by mouth every 6 (six) hours as needed for severe pain.       No Known Allergies  Consultations:  None   Procedures/Studies: Dg Chest 2 View  Result Date: 09/13/2016 CLINICAL DATA:  46 year old female with fever and chills. Possible sepsis. EXAM: CHEST  2 VIEW COMPARISON:  None. FINDINGS: The heart size and mediastinal contours are within normal limits. Both lungs are clear. The visualized skeletal structures are unremarkable. IMPRESSION: No active cardiopulmonary disease. Electronically Signed   By: Elgie Collard M.D.   On: 09/13/2016 18:41   Ct Abdomen  Pelvis W Contrast  Result Date: 09/13/2016 CLINICAL DATA:  Acute onset of right flank pain and fever. Recently diagnosed with urinary tract infection. Initial encounter. EXAM: CT ABDOMEN AND PELVIS WITH CONTRAST TECHNIQUE: Multidetector CT imaging of the abdomen and pelvis was performed using the standard protocol following bolus administration of intravenous contrast. CONTRAST:  ISOVUE-300 IOPAMIDOL (ISOVUE-300) INJECTION 61% COMPARISON:  None. FINDINGS: Lower chest: The  visualized lung bases are grossly clear. The visualized portions of the mediastinum are unremarkable. Hepatobiliary: The liver is unremarkable in appearance. The gallbladder is unremarkable in appearance. The common bile duct remains normal in caliber. Pancreas: The pancreas is within normal limits. Spleen: The spleen is unremarkable in appearance. Adrenals/Urinary Tract: The adrenal glands are unremarkable in appearance. Multiple foci of decreased enhancement are noted at the periphery of the right kidney, compatible with multifocal right-sided pyelonephritis. Surrounding perinephric stranding is noted. There is wall thickening along the course of the right ureter and at the left mid ureter, compatible with bilateral ureteritis. There is no evidence of significant hydronephrosis. No renal or ureteral stones are identified. A left renal cyst is noted. Stomach/Bowel: The stomach is unremarkable in appearance. The small bowel is within normal limits. The appendix is normal in caliber, without evidence of appendicitis. The colon is unremarkable in appearance. Vascular/Lymphatic: The abdominal aorta is unremarkable in appearance. The inferior vena cava is grossly unremarkable. No retroperitoneal lymphadenopathy is seen. No pelvic sidewall lymphadenopathy is identified. Reproductive: The bladder is mildly distended and grossly unremarkable. The uterus is unremarkable in appearance. The ovaries are symmetric and grossly unremarkable in appearance. No suspicious adnexal masses are seen. Other: No additional soft tissue abnormalities are seen. Musculoskeletal: No acute osseous abnormalities are identified. The visualized musculature is unremarkable in appearance. IMPRESSION: 1. Multifocal right-sided pyelonephritis noted. Wall thickening along the courses of both ureters, compatible with bilateral ureteritis. 2. Small left renal cyst noted. Electronically Signed   By: Roanna Raider M.D.   On: 09/13/2016 18:27       Subjective: Patient seen and examined at bedside. She feels much better. She was to go home. She still has some mild right flank pain and low-grade temperatures.  Discharge Exam: Vitals:   09/15/16 2057 09/16/16 0557  BP: (!) 90/55 133/66  Pulse: 73 69  Resp: 19 18  Temp: 98.9 F (37.2 C) 100.2 F (37.9 C)   Vitals:   09/15/16 1455 09/15/16 1730 09/15/16 2057 09/16/16 0557  BP: 115/79  (!) 90/55 133/66  Pulse: 61  73 69  Resp: 18  19 18   Temp: 100.2 F (37.9 C) 99.2 F (37.3 C) 98.9 F (37.2 C) 100.2 F (37.9 C)  TempSrc: Oral Oral Oral Oral  SpO2: 100%  99% 100%  Weight:      Height:        General: Pt is alert, awake, not in acute distress Cardiovascular: RRR, S1/S2 +, Respiratory: CTA bilaterally, no wheezing, no rhonchi Abdominal: Soft,Mild right flank tenderness, ND, bowel sounds + Extremities: no edema, no cyanosis    The results of significant diagnostics from this hospitalization (including imaging, microbiology, ancillary and laboratory) are listed below for reference.     Microbiology: Recent Results (from the past 240 hour(s))  Culture, blood (Routine x 2)     Status: Abnormal   Collection Time: 09/13/16  3:45 PM  Result Value Ref Range Status   Specimen Description BLOOD LEFT ANTECUBITAL  Final   Special Requests   Final    BOTTLES DRAWN AEROBIC AND ANAEROBIC  Blood Culture results may not be optimal due to an inadequate volume of blood received in culture bottles   Culture  Setup Time   Final    GRAM NEGATIVE RODS AEROBIC BOTTLE ONLY CRITICAL RESULT CALLED TO, READ BACK BY AND VERIFIED WITH: PHARMD A. PHAM 1641 09/14/16 T.TYSOR    Culture ESCHERICHIA COLI (A)  Final   Report Status 09/16/2016 FINAL  Final   Organism ID, Bacteria ESCHERICHIA COLI  Final      Susceptibility   Escherichia coli - MIC*    AMPICILLIN >=32 RESISTANT Resistant     CEFAZOLIN <=4 SENSITIVE Sensitive     CEFEPIME <=1 SENSITIVE Sensitive     CEFTAZIDIME <=1  SENSITIVE Sensitive     CEFTRIAXONE <=1 SENSITIVE Sensitive     CIPROFLOXACIN <=0.25 SENSITIVE Sensitive     GENTAMICIN <=1 SENSITIVE Sensitive     IMIPENEM <=0.25 SENSITIVE Sensitive     TRIMETH/SULFA <=20 SENSITIVE Sensitive     AMPICILLIN/SULBACTAM 16 INTERMEDIATE Intermediate     PIP/TAZO <=4 SENSITIVE Sensitive     Extended ESBL NEGATIVE Sensitive     * ESCHERICHIA COLI  Blood Culture ID Panel (Reflexed)     Status: Abnormal   Collection Time: 09/13/16  3:45 PM  Result Value Ref Range Status   Enterococcus species NOT DETECTED NOT DETECTED Final   Listeria monocytogenes NOT DETECTED NOT DETECTED Final   Staphylococcus species NOT DETECTED NOT DETECTED Final   Staphylococcus aureus NOT DETECTED NOT DETECTED Final   Streptococcus species NOT DETECTED NOT DETECTED Final   Streptococcus agalactiae NOT DETECTED NOT DETECTED Final   Streptococcus pneumoniae NOT DETECTED NOT DETECTED Final   Streptococcus pyogenes NOT DETECTED NOT DETECTED Final   Acinetobacter baumannii NOT DETECTED NOT DETECTED Final   Enterobacteriaceae species DETECTED (A) NOT DETECTED Final    Comment: Enterobacteriaceae represent a large family of gram-negative bacteria, not a single organism. CRITICAL RESULT CALLED TO, READ BACK BY AND VERIFIED WITH: PHARMD A. PHAM 1641 09/14/16 T.TYSOR    Enterobacter cloacae complex NOT DETECTED NOT DETECTED Final   Escherichia coli DETECTED (A) NOT DETECTED Final    Comment: CRITICAL RESULT CALLED TO, READ BACK BY AND VERIFIED WITH: PHARMD A. PHAM 1641 09/14/16 T.TYSOR    Klebsiella oxytoca NOT DETECTED NOT DETECTED Final   Klebsiella pneumoniae NOT DETECTED NOT DETECTED Final   Proteus species NOT DETECTED NOT DETECTED Final   Serratia marcescens NOT DETECTED NOT DETECTED Final   Carbapenem resistance NOT DETECTED NOT DETECTED Final   Haemophilus influenzae NOT DETECTED NOT DETECTED Final   Neisseria meningitidis NOT DETECTED NOT DETECTED Final   Pseudomonas aeruginosa  NOT DETECTED NOT DETECTED Final   Candida albicans NOT DETECTED NOT DETECTED Final   Candida glabrata NOT DETECTED NOT DETECTED Final   Candida krusei NOT DETECTED NOT DETECTED Final   Candida parapsilosis NOT DETECTED NOT DETECTED Final   Candida tropicalis NOT DETECTED NOT DETECTED Final  Culture, blood (Routine x 2)     Status: None (Preliminary result)   Collection Time: 09/13/16  4:05 PM  Result Value Ref Range Status   Specimen Description BLOOD RIGHT ANTECUBITAL  Final   Special Requests   Final    BOTTLES DRAWN AEROBIC AND ANAEROBIC Blood Culture results may not be optimal due to an inadequate volume of blood received in culture bottles   Culture   Final    NO GROWTH 3 DAYS Performed at [facility], [address] [place], [place] [address]    Report  Status PENDING  Incomplete  Urine culture     Status: None   Collection Time: 09/13/16  5:34 PM  Result Value Ref Range Status   Specimen Description URINE, RANDOM  Final   Special Requests NONE  Final   Culture   Final    NO GROWTH Performed at [facility], [address] [place], [place] [address]    Report Status 09/14/2016 FINAL  Final     Labs: BNP (last 3 results) No results for input(s): BNP in the last 8760 hours. Basic Metabolic Panel:  Recent Labs Lab 09/13/16 1545 09/14/16 0705 09/15/16 0648 09/16/16 0636  NA 134* 137 136 140  K 3.5 3.3* 3.5 3.8  CL 100* 107 106 104  CO2 23 22 23 29   GLUCOSE 119* 93 98 111*  BUN 13 9 7 13   CREATININE 0.83 0.61 0.56 0.63  CALCIUM 9.1 7.8* 7.9* 8.5*  MG  --   --  1.7 1.9   Liver Function Tests:  Recent Labs Lab 09/13/16 1545  AST 22  ALT 15  ALKPHOS 52  BILITOT 1.0  PROT 8.1  ALBUMIN 4.1   No results for input(s): LIPASE, AMYLASE in the last 168 hours. No results for input(s): AMMONIA in the last 168 hours. CBC:  Recent Labs Lab 09/13/16 1545 09/14/16 0705 09/15/16 0648 09/16/16 0636  WBC 11.9* 7.6 5.9 6.0  NEUTROABS 9.9*  --  4.3  4.2  HGB 13.8 10.5* 10.5* 11.0*  HCT 40.2 31.1* 30.8* 33.3*  MCV 86.8 87.6 86.5 86.9  PLT 313 239 218 267   Cardiac Enzymes: No results for input(s): CKTOTAL, CKMB, CKMBINDEX, TROPONINI in the last 168 hours. BNP: Invalid input(s): POCBNP CBG: No results for input(s): GLUCAP in the last 168 hours. D-Dimer No results for input(s): DDIMER in the last 72 hours. Hgb A1c No results for input(s): HGBA1C in the last 72 hours. Lipid Profile No results for input(s): CHOL, HDL, LDLCALC, TRIG, CHOLHDL, LDLDIRECT in the last 72 hours. Thyroid function studies No results for input(s): TSH, T4TOTAL, T3FREE, THYROIDAB in the last 72 hours.  Invalid input(s): FREET3 Anemia work up No results for input(s): VITAMINB12, FOLATE, FERRITIN, TIBC, IRON, RETICCTPCT in the last 72 hours. Urinalysis    Component Value Date/Time   COLORURINE YELLOW 09/13/2016 1734   APPEARANCEUR CLOUDY (A) 09/13/2016 1734   LABSPEC 1.012 09/13/2016 1734   PHURINE 7.5 09/13/2016 1734   GLUCOSEU NEGATIVE 09/13/2016 1734   HGBUR TRACE (A) 09/13/2016 1734   BILIRUBINUR NEGATIVE 09/13/2016 1734   KETONESUR 15 (A) 09/13/2016 1734   PROTEINUR NEGATIVE 09/13/2016 1734   NITRITE POSITIVE (A) 09/13/2016 1734   LEUKOCYTESUR LARGE (A) 09/13/2016 1734   Sepsis Labs Invalid input(s): PROCALCITONIN,  WBC,  LACTICIDVEN Microbiology Recent Results (from the past 240 hour(s))  Culture, blood (Routine x 2)     Status: Abnormal   Collection Time: 09/13/16  3:45 PM  Result Value Ref Range Status   Specimen Description BLOOD LEFT ANTECUBITAL  Final   Special Requests   Final    BOTTLES DRAWN AEROBIC AND ANAEROBIC Blood Culture results may not be optimal due to an inadequate volume of blood received in culture bottles   Culture  Setup Time   Final    GRAM NEGATIVE RODS AEROBIC BOTTLE ONLY CRITICAL RESULT CALLED TO, READ BACK BY AND VERIFIED WITH: PHARMD A. PHAM 1641 09/14/16 T.TYSOR    Culture ESCHERICHIA COLI (A)  Final    Report Status 09/16/2016 FINAL  Final  Organism ID, Bacteria ESCHERICHIA COLI  Final      Susceptibility   Escherichia coli - MIC*    AMPICILLIN >=32 RESISTANT Resistant     CEFAZOLIN <=4 SENSITIVE Sensitive     CEFEPIME <=1 SENSITIVE Sensitive     CEFTAZIDIME <=1 SENSITIVE Sensitive     CEFTRIAXONE <=1 SENSITIVE Sensitive     CIPROFLOXACIN <=0.25 SENSITIVE Sensitive     GENTAMICIN <=1 SENSITIVE Sensitive     IMIPENEM <=0.25 SENSITIVE Sensitive     TRIMETH/SULFA <=20 SENSITIVE Sensitive     AMPICILLIN/SULBACTAM 16 INTERMEDIATE Intermediate     PIP/TAZO <=4 SENSITIVE Sensitive     Extended ESBL NEGATIVE Sensitive     * ESCHERICHIA COLI  Blood Culture ID Panel (Reflexed)     Status: Abnormal   Collection Time: 09/13/16  3:45 PM  Result Value Ref Range Status   Enterococcus species NOT DETECTED NOT DETECTED Final   Listeria monocytogenes NOT DETECTED NOT DETECTED Final   Staphylococcus species NOT DETECTED NOT DETECTED Final   Staphylococcus aureus NOT DETECTED NOT DETECTED Final   Streptococcus species NOT DETECTED NOT DETECTED Final   Streptococcus agalactiae NOT DETECTED NOT DETECTED Final   Streptococcus pneumoniae NOT DETECTED NOT DETECTED Final   Streptococcus pyogenes NOT DETECTED NOT DETECTED Final   Acinetobacter baumannii NOT DETECTED NOT DETECTED Final   Enterobacteriaceae species DETECTED (A) NOT DETECTED Final    Comment: Enterobacteriaceae represent a large family of gram-negative bacteria, not a single organism. CRITICAL RESULT CALLED TO, READ BACK BY AND VERIFIED WITH: PHARMD A. PHAM 1641 09/14/16 T.TYSOR    Enterobacter cloacae complex NOT DETECTED NOT DETECTED Final   Escherichia coli DETECTED (A) NOT DETECTED Final    Comment: CRITICAL RESULT CALLED TO, READ BACK BY AND VERIFIED WITH: PHARMD A. PHAM 1641 09/14/16 T.TYSOR    Klebsiella oxytoca NOT DETECTED NOT DETECTED Final   Klebsiella pneumoniae NOT DETECTED NOT DETECTED Final   Proteus species NOT DETECTED  NOT DETECTED Final   Serratia marcescens NOT DETECTED NOT DETECTED Final   Carbapenem resistance NOT DETECTED NOT DETECTED Final   Haemophilus influenzae NOT DETECTED NOT DETECTED Final   Neisseria meningitidis NOT DETECTED NOT DETECTED Final   Pseudomonas aeruginosa NOT DETECTED NOT DETECTED Final   Candida albicans NOT DETECTED NOT DETECTED Final   Candida glabrata NOT DETECTED NOT DETECTED Final   Candida krusei NOT DETECTED NOT DETECTED Final   Candida parapsilosis NOT DETECTED NOT DETECTED Final   Candida tropicalis NOT DETECTED NOT DETECTED Final  Culture, blood (Routine x 2)     Status: None (Preliminary result)   Collection Time: 09/13/16  4:05 PM  Result Value Ref Range Status   Specimen Description BLOOD RIGHT ANTECUBITAL  Final   Special Requests   Final    BOTTLES DRAWN AEROBIC AND ANAEROBIC Blood Culture results may not be optimal due to an inadequate volume of blood received in culture bottles   Culture   Final    NO GROWTH 3 DAYS Performed at [facility], [address] [place], [place] [address]    Report Status PENDING  Incomplete  Urine culture     Status: None   Collection Time: 09/13/16  5:34 PM  Result Value Ref Range Status   Specimen Description URINE, RANDOM  Final   Special Requests NONE  Final   Culture   Final    NO GROWTH Performed at [facility], [address] [place], [place] [address]    Report Status 09/14/2016  FINAL  Final     Time coordinating discharge: 35 minutes  SIGNED:   Glade Lloyd, MD  [facility] 09/16/2016, 10:43 AM Pager: [phone]  If 7PM-7AM, please contact night-coverage [email or web address] Password [email or web address]

## 2016-09-16 NOTE — Care Management Note (Signed)
Case Management Note  Patient Details  Name: Jacqulyn CaneCherise Tolle MRN: [id number] Date of Birth: 04-13-1970  Subjective/Objective:      Acute pyelonephritis               Action/Plan: Discharge Planning: NCM spoke to pt and states she was [facility] [address] [facility] States her PCP left, she will call office to arrange appointment.    Expected Discharge Date:  09/16/16               Expected Discharge Plan:  Home/Self Care  In-House Referral:  NA  Discharge planning Services  CM Consult  Post Acute Care Choice:  NA Choice offered to:  NA  DME Arranged:  N/A DME Agency:  NA  HH Arranged:  NA HH Agency:  NA  Status of Service:  Completed, signed off  If discussed at Long Length of Stay Meetings, dates discussed:    Additional Comments:  Elliot CousinShavis, Schuyler Olden Ellen, RN 09/16/2016, 11:48 AM

## 2016-09-16 NOTE — Discharge Instructions (Signed)
Pyelonephritis, Adult Pyelonephritis is a kidney infection. The kidneys are organs that help clean your blood by moving waste out of your blood and into your pee (urine). This infection can happen quickly, or it can last for a long time. In most cases, it clears up with treatment and does not cause other problems. Follow these instructions at home: Medicines  Take over-the-counter and prescription medicines only as told by your doctor.  Take your antibiotic medicine as told by your doctor. Do not stop taking the medicine even if you start to feel better. General instructions  Drink enough fluid to keep your pee clear or pale yellow.  Avoid caffeine, tea, and carbonated drinks.  Pee (urinate) often. Avoid holding in pee for long periods of time.  Pee before and after sex.  After pooping (having a bowel movement), women should wipe from front to back. Use each tissue only once.  Keep all follow-up visits as told by your doctor. This is important. Contact a doctor if:  You do not feel better after 2 days.  Your symptoms get worse.  You have a fever. Get help right away if:  You cannot take your medicine or drink fluids as told.  You have chills and shaking.  You throw up (vomit).  You have very bad pain in your side (flank) or back.  You feel very weak or you pass out (faint). This information is not intended to replace advice given to you by your health care provider. Make sure you discuss any questions you have with your health care provider. Document Released: 04/20/2004 Document Revised: 08/19/2015 Document Reviewed: 07/06/2014 Elsevier Interactive Patient Education  2018 [employer]  

## 2016-09-18 LAB — CULTURE, BLOOD (ROUTINE X 2): Culture: NO GROWTH

## 2017-01-29 ENCOUNTER — Encounter (HOSPITAL_COMMUNITY): Payer: Self-pay | Admitting: Psychiatry

## 2017-01-29 ENCOUNTER — Ambulatory Visit (HOSPITAL_COMMUNITY): Payer: PRIVATE HEALTH INSURANCE | Admitting: Psychiatry

## 2017-01-29 ENCOUNTER — Encounter (INDEPENDENT_AMBULATORY_CARE_PROVIDER_SITE_OTHER): Payer: Self-pay

## 2017-01-29 VITALS — BP 118/72 | HR 69 | Ht 61.0 in | Wt 115.2 lb

## 2017-01-29 DIAGNOSIS — R4587 Impulsiveness: Secondary | ICD-10-CM | POA: Diagnosis not present

## 2017-01-29 DIAGNOSIS — F419 Anxiety disorder, unspecified: Secondary | ICD-10-CM

## 2017-01-29 DIAGNOSIS — Z62810 Personal history of physical and sexual abuse in childhood: Secondary | ICD-10-CM

## 2017-01-29 DIAGNOSIS — Z811 Family history of alcohol abuse and dependence: Secondary | ICD-10-CM

## 2017-01-29 DIAGNOSIS — Z62811 Personal history of psychological abuse in childhood: Secondary | ICD-10-CM | POA: Diagnosis not present

## 2017-01-29 DIAGNOSIS — F4312 Post-traumatic stress disorder, chronic: Secondary | ICD-10-CM | POA: Diagnosis not present

## 2017-01-29 DIAGNOSIS — R4584 Anhedonia: Secondary | ICD-10-CM | POA: Diagnosis not present

## 2017-01-29 DIAGNOSIS — F22 Delusional disorders: Secondary | ICD-10-CM

## 2017-01-29 DIAGNOSIS — F4010 Social phobia, unspecified: Secondary | ICD-10-CM

## 2017-01-29 DIAGNOSIS — R450 Nervousness: Secondary | ICD-10-CM

## 2017-01-29 DIAGNOSIS — F331 Major depressive disorder, recurrent, moderate: Secondary | ICD-10-CM

## 2017-01-29 DIAGNOSIS — Z87891 Personal history of nicotine dependence: Secondary | ICD-10-CM

## 2017-01-29 DIAGNOSIS — G4700 Insomnia, unspecified: Secondary | ICD-10-CM | POA: Diagnosis not present

## 2017-01-29 DIAGNOSIS — R454 Irritability and anger: Secondary | ICD-10-CM

## 2017-01-29 DIAGNOSIS — R4586 Emotional lability: Secondary | ICD-10-CM | POA: Diagnosis not present

## 2017-01-29 MED ORDER — SERTRALINE HCL 50 MG PO TABS: 50.0000 mg | ORAL_TABLET | Freq: Every day | ORAL | 2 refills | Status: DC

## 2017-01-29 MED ORDER — GUANFACINE HCL 1 MG PO TABS: 1.0000 mg | ORAL_TABLET | Freq: Three times a day (TID) | ORAL | 2 refills | Status: DC
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
## 2017-01-29 NOTE — Patient Instructions (Signed)
Read the book "Becoming Attached"  Read about PTSD   Start Zoloft 50 mg in the morning (okay to take 1/2 tablet if it give you upset stomach)  Start Tenex (guanfacine) 3 times a day - this is a 1 mg tablet

## 2017-03-22 ENCOUNTER — Telehealth (HOSPITAL_COMMUNITY): Payer: Self-pay

## 2017-03-22 NOTE — Telephone Encounter (Signed)
Medication refill - Fax received from patient's [facility] requesting a new 90 day order for patient's prescribed Sertraline, last ordered and started 01/29/17 and pt. returns 04/24/17

## 2017-04-10 ENCOUNTER — Ambulatory Visit: Payer: PRIVATE HEALTH INSURANCE | Admitting: Psychology

## 2017-04-10 DIAGNOSIS — F4310 Post-traumatic stress disorder, unspecified: Secondary | ICD-10-CM

## 2017-04-10 DIAGNOSIS — F6381 Intermittent explosive disorder: Secondary | ICD-10-CM | POA: Diagnosis not present

## 2017-04-24 ENCOUNTER — Ambulatory Visit (HOSPITAL_COMMUNITY): Payer: Self-pay | Admitting: Psychiatry

## 2017-05-08 ENCOUNTER — Ambulatory Visit: Payer: PRIVATE HEALTH INSURANCE | Admitting: Psychology

## 2017-05-16 ENCOUNTER — Telehealth (HOSPITAL_COMMUNITY): Payer: Self-pay

## 2017-05-16 DIAGNOSIS — F4312 Post-traumatic stress disorder, chronic: Secondary | ICD-10-CM

## 2017-05-16 NOTE — Telephone Encounter (Signed)
Medication refill - fax from pt's [facility] requesting a refill of her prescribed Sertraline, last ordered 01/29/17 + 2 refills. Patient canceled appt. 04/24/17 and has rescheduled for 05/28/17

## 2017-05-16 NOTE — Telephone Encounter (Signed)
Sure that is fine, thank you!

## 2017-05-19 ENCOUNTER — Telehealth (HOSPITAL_COMMUNITY): Payer: Self-pay

## 2017-05-19 DIAGNOSIS — F4312 Post-traumatic stress disorder, chronic: Secondary | ICD-10-CM

## 2017-05-19 MED ORDER — SERTRALINE HCL 50 MG PO TABS: 50.0000 mg | ORAL_TABLET | Freq: Every day | ORAL | 0 refills | Status: DC

## 2017-05-19 NOTE — Telephone Encounter (Signed)
Medication refill request - Fax received requesting a refill of patient's prescribed Intuniv, last ordered 01/29/17 + 2 refills. Pt. cancelled 04/24/17 and is rescheduled for 05/28/17.

## 2017-05-19 NOTE — Telephone Encounter (Signed)
A new Sertraline 50 mg, one a day order, #30 with no refills was e-scribed to [facility] as was approved by Dr. Rene KocherEksir.

## 2017-05-21 MED ORDER — GUANFACINE HCL 1 MG PO TABS: 1.0000 mg | ORAL_TABLET | Freq: Three times a day (TID) | ORAL | 0 refills | Status: DC

## 2017-05-21 NOTE — Telephone Encounter (Signed)
A new Guanfacine 1 mg, three times a day, 30 day supply with no refills e-scribed to [facility] in [place], per Dr. Unice BaileyEksir's authorization, with instruction, evaluation required for further refills. Patient is scheduled for follow up on 05/28/17.

## 2017-05-21 NOTE — Telephone Encounter (Signed)
Yes that's fine 

## 2017-05-25 ENCOUNTER — Ambulatory Visit: Payer: PRIVATE HEALTH INSURANCE | Admitting: Psychology

## 2017-05-28 ENCOUNTER — Ambulatory Visit (HOSPITAL_COMMUNITY): Payer: PRIVATE HEALTH INSURANCE | Admitting: Psychiatry

## 2017-05-28 ENCOUNTER — Encounter (HOSPITAL_COMMUNITY): Payer: Self-pay | Admitting: Psychiatry

## 2017-05-28 VITALS — BP 116/70 | HR 72 | Ht 61.0 in | Wt 120.0 lb

## 2017-05-28 DIAGNOSIS — G4709 Other insomnia: Secondary | ICD-10-CM | POA: Diagnosis not present

## 2017-05-28 DIAGNOSIS — F4312 Post-traumatic stress disorder, chronic: Secondary | ICD-10-CM

## 2017-05-28 MED ORDER — SERTRALINE HCL 100 MG PO TABS: ORAL_TABLET | ORAL | 0 refills | Status: DC

## 2017-05-28 MED ORDER — TRAZODONE HCL 100 MG PO TABS: 100.0000 mg | ORAL_TABLET | Freq: Every day | ORAL | 0 refills | Status: DC

## 2017-05-28 NOTE — Progress Notes (Signed)
BH MD/PA/NP OP Progress Note  05/28/2017 3:51 PM Lori Oneal  MRN:  [id number]  Chief Complaint: med management HPI: Lori Oneal reports that she has not had any side effects from Zoloft or guanfacine.  She does not feel particularly different.  She does not feel that the guanfacine has reduced her anxiety at all.  I suggested we taper/discontinue given that it has not provided any benefit, and we rapidly increase Zoloft as discussed below.  She continues to struggle with frequent awakenings during the night, no sleep onset insomnia.  We discussed the use of trazodone and the risks and benefits associated.  She has establish therapy and is working on getting in on a more routine basis.  No suicidality or unsafe thoughts, no substance abuse.  She remains committed to working on her mental health.  She reports that she has read about 50% of the book, becoming attached, and it has been heart wrenching at times, but she is determined to continue reading it and learning.  Visit Diagnosis:    ICD-10-CM   1. Other insomnia G47.09   2. Chronic post-traumatic stress disorder (PTSD) F43.12 sertraline (ZOLOFT) 100 MG tablet    Past Psychiatric History: See intake H&P for full details. Reviewed, with no updates at this time.  Past Medical History:  Past Medical History:  Diagnosis Date  . Bipolar disorder [facility])     Past Surgical History:  Procedure Laterality Date  . TUBAL LIGATION      Family Psychiatric History: See intake H&P for full details. Reviewed, with no updates at this time.   Family History:  Family History  Problem Relation Age of Onset  . COPD Mother   . Diabetes Other     Social History:  Social History   Socioeconomic History  . Marital status: Married    Spouse name: [name]  . Number of children: 3  . Years of education: None  . Highest education level: 10th grade  Social Needs  . Financial resource strain: Not very hard  . Food insecurity - worry: Never true  .  Food insecurity - inability: Never true  . Transportation needs - medical: No  . Transportation needs - non-medical: No  Occupational History  . None  Tobacco Use  . Smoking status: Former Games developer  . Smokeless tobacco: Never Used  Substance and Sexual Activity  . Alcohol use: No  . Drug use: No  . Sexual activity: Yes    Birth control/protection: Surgical  Other Topics Concern  . None  Social History Narrative  . None    Allergies: No Known Allergies  Metabolic Disorder Labs: No results found for: HGBA1C, MPG No results found for: PROLACTIN No results found for: CHOL, TRIG, HDL, CHOLHDL, VLDL, LDLCALC No results found for: TSH  Therapeutic Level Labs: No results found for: LITHIUM No results found for: VALPROATE No components found for:  CBMZ  Current Medications: Current Outpatient Medications  Medication Sig Dispense Refill  . estrogen, conjugated,-medroxyprogesterone (PREMPRO) 0.625-2.5 MG tablet Take 1 tablet by mouth daily.    Marland Kitchen omeprazole (PRILOSEC) 40 MG capsule Take 40 mg by mouth daily.    . sertraline (ZOLOFT) 100 MG tablet Take 100 mg daily for 2 weeks, then increase to 150 mg daily for 2 weeks, then 200 mg daily 180 tablet 0  . traZODone (DESYREL) 100 MG tablet Take 1 tablet (100 mg total) by mouth at bedtime. 90 tablet 0   No current facility-administered medications for this visit.  Musculoskeletal: Strength & Muscle Tone: within normal limits Gait & Station: normal Patient leans: N/A  Psychiatric Specialty Exam: ROS  Blood pressure 116/70, pulse 72, height 5\' 1"  (1.549 m), weight 120 lb (54.4 kg).Body mass index is 22.67 kg/m.  General Appearance: Casual and Well Groomed  Eye Contact:  Fair  Speech:  Clear and Coherent and Normal Rate  Volume:  Normal  Mood:  Anxious and Dysphoric  Affect:  Appropriate and Congruent  Thought Process:  Goal Directed and Descriptions of Associations: Intact  Orientation:  Full (Time, Place, and Person)   Thought Content: Logical   Suicidal Thoughts:  No  Homicidal Thoughts:  No  Memory:  Immediate;   Fair  Judgement:  Fair  Insight:  Fair  Psychomotor Activity:  Normal  Concentration:  Concentration: Fair  Recall:  Good  Fund of Knowledge: Good  Language: Good  Akathisia:  Negative  Handed:  Right  AIMS (if indicated): not done  Assets:  Communication Skills Desire for Improvement Financial Resources/Insurance Housing Intimacy Talents/Skills Transportation Vocational/Educational  ADL's:  Intact  Cognition: WNL  Sleep:  Poor   Screenings:   Assessment and Plan:  Lori Oneal presents for medication management follow-up.  She would benefit from a titration of Zoloft as below, and the addition of trazodone for frequent awakenings.  She does not have any safety issues, and is actively engaged in therapy, and continuing to learn and read about PTSD.  She presents as motivated to engage in care.  We will proceed as below and follow-up in 2 months.  1. Other insomnia   2. Chronic post-traumatic stress disorder (PTSD)     Status of current problems: unchanged  Labs Ordered: No orders of the defined types were placed in this encounter.   Labs Reviewed: N/A  Collateral Obtained/Records Reviewed: N/A  Plan:  Zoloft increased to 100 mg x 2 weeks, then 150 mg x 2 weeks, then 200 mg daily trazodone 100 mg nightly; okay to take half Discontinue Tenex given lack of benefit  I spent 15 minutes with the patient in direct face-to-face clinical care.  Greater than 50% of this time was spent in counseling and coordination of care with the patient.    Burnard LeighAlexander Arya Shavar Gorka, MD 05/28/2017, 3:51 PM

## 2017-05-30 ENCOUNTER — Ambulatory Visit: Payer: PRIVATE HEALTH INSURANCE | Admitting: Psychology

## 2017-05-30 DIAGNOSIS — F6381 Intermittent explosive disorder: Secondary | ICD-10-CM

## 2017-05-30 DIAGNOSIS — F4310 Post-traumatic stress disorder, unspecified: Secondary | ICD-10-CM

## 2017-06-29 ENCOUNTER — Ambulatory Visit (INDEPENDENT_AMBULATORY_CARE_PROVIDER_SITE_OTHER): Payer: PRIVATE HEALTH INSURANCE | Admitting: Psychology

## 2017-06-29 DIAGNOSIS — F6381 Intermittent explosive disorder: Secondary | ICD-10-CM | POA: Diagnosis not present

## 2017-06-29 DIAGNOSIS — F4310 Post-traumatic stress disorder, unspecified: Secondary | ICD-10-CM | POA: Diagnosis not present

## 2017-07-20 ENCOUNTER — Ambulatory Visit: Payer: PRIVATE HEALTH INSURANCE | Admitting: Psychology

## 2017-07-20 DIAGNOSIS — F6381 Intermittent explosive disorder: Secondary | ICD-10-CM | POA: Diagnosis not present

## 2017-07-20 DIAGNOSIS — F4310 Post-traumatic stress disorder, unspecified: Secondary | ICD-10-CM | POA: Diagnosis not present

## 2017-08-02 ENCOUNTER — Ambulatory Visit: Payer: PRIVATE HEALTH INSURANCE | Admitting: Psychology

## 2017-08-25 ENCOUNTER — Telehealth (HOSPITAL_COMMUNITY): Payer: Self-pay

## 2017-08-25 DIAGNOSIS — F4312 Post-traumatic stress disorder, chronic: Secondary | ICD-10-CM

## 2017-08-25 NOTE — Telephone Encounter (Signed)
Patient is requesting a refill on Trazodone 100mg  and Sertraline 150. States she is now taking sertraline 150mg  and she is feeling so much better.

## 2017-08-27 MED ORDER — TRAZODONE HCL 100 MG PO TABS: 100.0000 mg | ORAL_TABLET | Freq: Every day | ORAL | 0 refills | Status: DC

## 2017-08-27 MED ORDER — SERTRALINE HCL 100 MG PO TABS: ORAL_TABLET | ORAL | 0 refills | Status: DC

## 2017-08-27 NOTE — Telephone Encounter (Signed)
Per Dr. Rene KocherEksir, I sent in a refill for both Trazodone and Sertraline. Left a vm for patient informing her of medication sent.

## 2017-08-27 NOTE — Telephone Encounter (Signed)
Great, okay to send 90 days refill of both

## 2017-08-28 ENCOUNTER — Ambulatory Visit (HOSPITAL_COMMUNITY): Payer: PRIVATE HEALTH INSURANCE | Admitting: Psychiatry

## 2017-08-28 ENCOUNTER — Encounter (HOSPITAL_COMMUNITY): Payer: Self-pay | Admitting: Psychiatry

## 2017-08-28 VITALS — BP 110/68 | HR 73 | Ht 61.0 in | Wt 114.0 lb

## 2017-08-28 DIAGNOSIS — N951 Menopausal and female climacteric states: Secondary | ICD-10-CM | POA: Diagnosis not present

## 2017-08-28 DIAGNOSIS — Z79899 Other long term (current) drug therapy: Secondary | ICD-10-CM | POA: Diagnosis not present

## 2017-08-28 DIAGNOSIS — L749 Eccrine sweat disorder, unspecified: Secondary | ICD-10-CM

## 2017-08-28 DIAGNOSIS — Z87891 Personal history of nicotine dependence: Secondary | ICD-10-CM

## 2017-08-28 DIAGNOSIS — F39 Unspecified mood [affective] disorder: Secondary | ICD-10-CM

## 2017-08-28 DIAGNOSIS — R61 Generalized hyperhidrosis: Secondary | ICD-10-CM

## 2017-08-28 DIAGNOSIS — F419 Anxiety disorder, unspecified: Secondary | ICD-10-CM | POA: Diagnosis not present

## 2017-08-28 DIAGNOSIS — G479 Sleep disorder, unspecified: Secondary | ICD-10-CM

## 2017-08-28 MED ORDER — PROPRANOLOL HCL ER 60 MG PO CP24: 60.0000 mg | ORAL_CAPSULE | Freq: Every day | ORAL | 0 refills | Status: AC

## 2017-08-28 NOTE — Progress Notes (Signed)
BH MD/PA/NP OP Progress Note  08/28/2017 3:09 PM Lori Oneal  MRN:  [id number]  Chief Complaint: med check HPI: Aviyah Swetz presents with significant improvement of her depressive symptoms, sleeping well at night, anxiety has been well managed.  She takes trazodone 100 mg and Zoloft 150 mg.  No significant side effects to report.  She continues to struggle with perimenopausal symptoms and hot flashes, sweating episodes.  We discussed the potential utility of propranolol extended release 60 mg daily, and the risks of potential sedation or hypotension.  She was agreeable to a trial of the medication and to follow-up in 2-3 months or sooner if needed.  She shares that she feels so positive and hopeful, and has received ample positive feedback from family and friends noticing that she has really made a big turnaround in her mood and is using her coping strategies that she has learned in therapy quite effectively.  Visit Diagnosis: No diagnosis found.  Past Psychiatric History: See intake H&P for full details. Reviewed, with no updates at this time.   Past Medical History:  Past Medical History:  Diagnosis Date  . Bipolar disorder [facility])     Past Surgical History:  Procedure Laterality Date  . TUBAL LIGATION      Family Psychiatric History: See intake H&P for full details. Reviewed, with no updates at this time.   Family History:  Family History  Problem Relation Age of Onset  . COPD Mother   . Diabetes Other     Social History:  Social History   Socioeconomic History  . Marital status: Married    Spouse name: [name]  . Number of children: 3  . Years of education: Not on file  . Highest education level: 10th grade  Occupational History  . Not on file  Social Needs  . Financial resource strain: Not very hard  . Food insecurity:    Worry: Never true    Inability: Never true  . Transportation needs:    Medical: No    Non-medical: No  Tobacco Use  . Smoking status: Former  Games developer  . Smokeless tobacco: Never Used  Substance and Sexual Activity  . Alcohol use: No  . Drug use: No  . Sexual activity: Yes    Birth control/protection: Surgical  Lifestyle  . Physical activity:    Days per week: 0 days    Minutes per session: 0 min  . Stress: Very much  Relationships  . Social connections:    Talks on phone: Once a week    Gets together: Once a week    Attends religious service: Never    Active member of club or organization: No    Attends meetings of clubs or organizations: Never    Relationship status: Married  Other Topics Concern  . Not on file  Social History Narrative  . Not on file    Allergies: No Known Allergies  Metabolic Disorder Labs: No results found for: HGBA1C, MPG No results found for: PROLACTIN No results found for: CHOL, TRIG, HDL, CHOLHDL, VLDL, LDLCALC No results found for: TSH  Therapeutic Level Labs: No results found for: LITHIUM No results found for: VALPROATE No components found for:  CBMZ  Current Medications: Current Outpatient Medications  Medication Sig Dispense Refill  . omeprazole (PRILOSEC) 40 MG capsule Take 40 mg by mouth daily.    . sertraline (ZOLOFT) 100 MG tablet Take 100 mg daily for 2 weeks, then increase to 150 mg daily for 2 weeks, then 200  mg daily 180 tablet 0  . traZODone (DESYREL) 100 MG tablet Take 1 tablet (100 mg total) by mouth at bedtime. 90 tablet 0  . estrogen, conjugated,-medroxyprogesterone (PREMPRO) 0.625-2.5 MG tablet Take 1 tablet by mouth daily.     No current facility-administered medications for this visit.      Musculoskeletal: Strength & Muscle Tone: within normal limits Gait & Station: normal Patient leans: N/A  Psychiatric Specialty Exam: ROS  Blood pressure 110/68, pulse 73, height 5\' 1"  (1.549 m), weight 114 lb (51.7 kg).Body mass index is 21.54 kg/m.  General Appearance: Casual and Well Groomed  Eye Contact:  Good  Speech:  Clear and Coherent and Normal Rate   Volume:  Normal  Mood:  Euthymic  Affect:  Appropriate and Congruent  Thought Process:  Coherent and Descriptions of Associations: Intact  Orientation:  Full (Time, Place, and Person)  Thought Content: Logical   Suicidal Thoughts:  No  Homicidal Thoughts:  No  Memory:  NA Immediate;   Good  Judgement:  Good  Insight:  Good  Psychomotor Activity:  Normal  Concentration:  Attention Span: Good  Recall:  Good  Fund of Knowledge: Good  Language: Good  Akathisia:  Negative  Handed:  Right  AIMS (if indicated): not done  Assets:  Communication Skills Desire for Improvement Financial Resources/Insurance Housing Intimacy Leisure Time Physical Health Resilience Social Support Talents/Skills Transportation Vocational/Educational  ADL's:  Intact  Cognition: WNL  Sleep:  Good   Screenings:   Assessment and Plan:  Lori Oneal presents with substantial improvements in mood, anxiety, sleep.  She has some ongoing difficulty with sweating and perimenopausal hot flashes.  We agreed to use a low-dose of extended release propranolol to see if we can target some of these physical complaints.  I am hopeful this will also add some value in terms of general anxiety reduction.  Disclosed to patient that this Clinical research associatewriter is leaving this practice at the end of August 2019, and patients always has the right to choose their provider. Reassured patient that office will work to provide smooth transition of care whether they wish to remain at this office, or to continue with this provider, or seek alternative care options in community.  They expressed understanding.   1. Sweating abnormality     Status of current problems: gradually improving  Labs Ordered: No orders of the defined types were placed in this encounter.   Labs Reviewed: n/a  Collateral Obtained/Records Reviewed: n/a  Plan:  Continue trazodone 100 mg nightly Zoloft 150 mg daily Propranolol ER 60 mg daily rtc 3  months  Burnard LeighAlexander Arya Tonilynn Bieker, MD 08/28/2017, 3:09 PM

## 2017-08-31 ENCOUNTER — Ambulatory Visit: Payer: PRIVATE HEALTH INSURANCE | Admitting: Psychology

## 2017-08-31 DIAGNOSIS — F6381 Intermittent explosive disorder: Secondary | ICD-10-CM

## 2017-08-31 DIAGNOSIS — F4310 Post-traumatic stress disorder, unspecified: Secondary | ICD-10-CM | POA: Diagnosis not present

## 2017-09-14 ENCOUNTER — Ambulatory Visit: Payer: PRIVATE HEALTH INSURANCE | Admitting: Psychology

## 2017-10-19 ENCOUNTER — Ambulatory Visit: Payer: PRIVATE HEALTH INSURANCE | Admitting: Psychology

## 2017-10-22 ENCOUNTER — Ambulatory Visit: Payer: Self-pay | Admitting: Psychology

## 2018-04-18 IMAGING — CT CT ABD-PELV W/ CM
2 of 5 series · 15 of 46 positions shown, 17 images · IV contrast (APPLIED)
Comparison: None.

CLINICAL DATA: Acute onset of right flank pain and fever. Recently
diagnosed with urinary tract infection. Initial encounter.

EXAM:
CT ABDOMEN AND PELVIS WITH CONTRAST
TECHNIQUE: Multidetector CT imaging of the abdomen and pelvis was performed
using the standard protocol following bolus administration of
intravenous contrast.
CONTRAST:  100mL 3YNUOF-HPP IOPAMIDOL (3YNUOF-HPP) INJECTION 61%

[Series 2: axial st · axial · 0.86mm/px · z∈[-607,-212]mm · 12 of 89 slices shown, 14 images]
[im 5/89  soft-tissue]
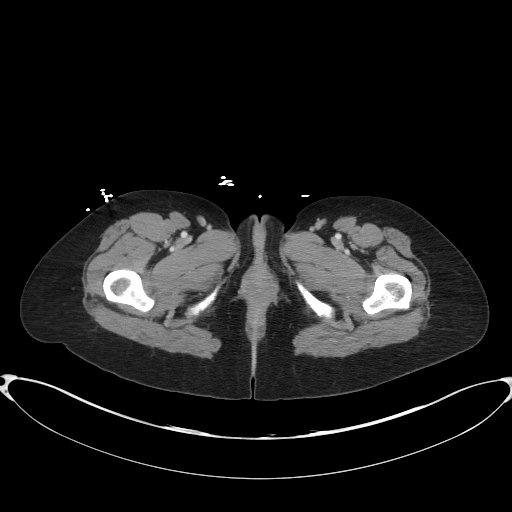
[im 5/89  bone]
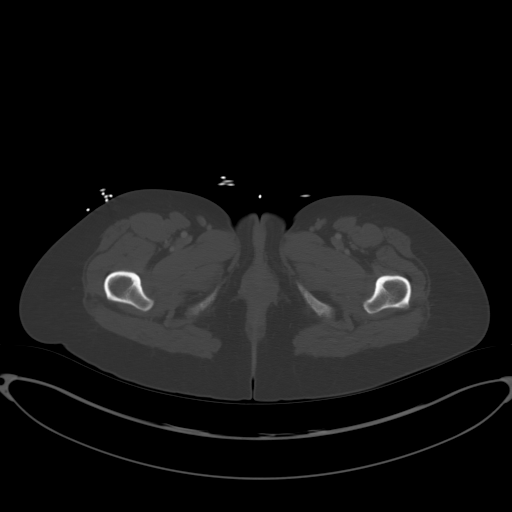
[im 15/89  soft-tissue]
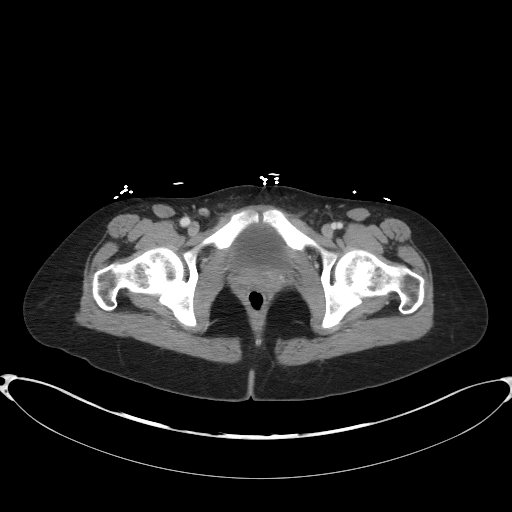
[im 20/89  soft-tissue]
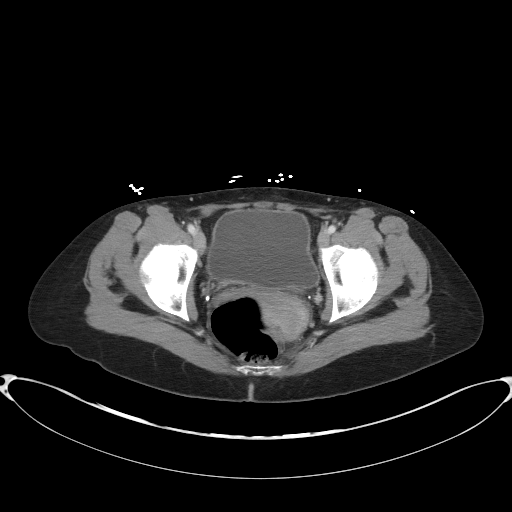
[im 25/89  soft-tissue]
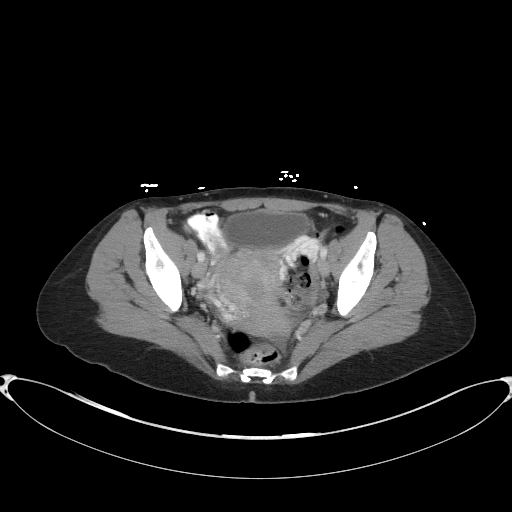
[im 35/89  soft-tissue]
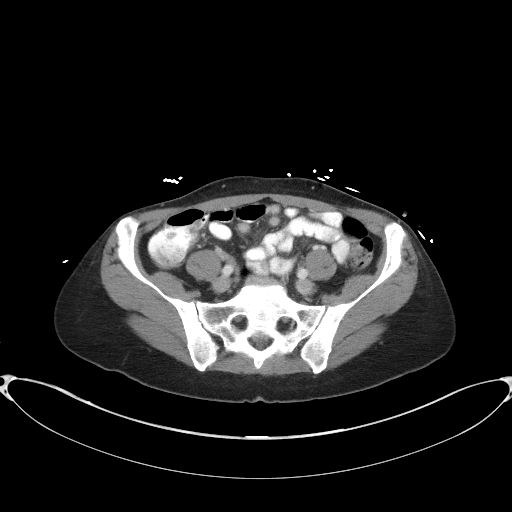
[im 40/89  soft-tissue]
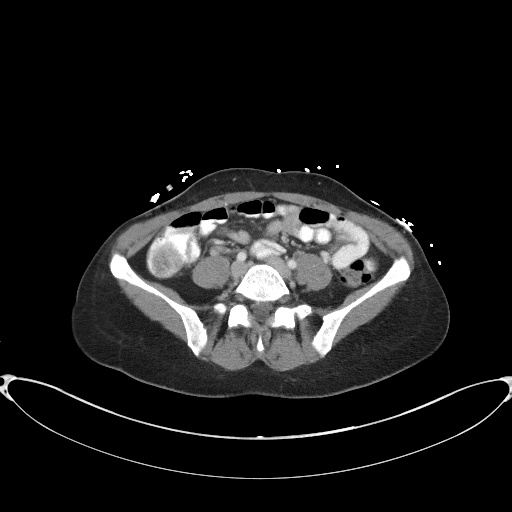
[im 49/89  soft-tissue]
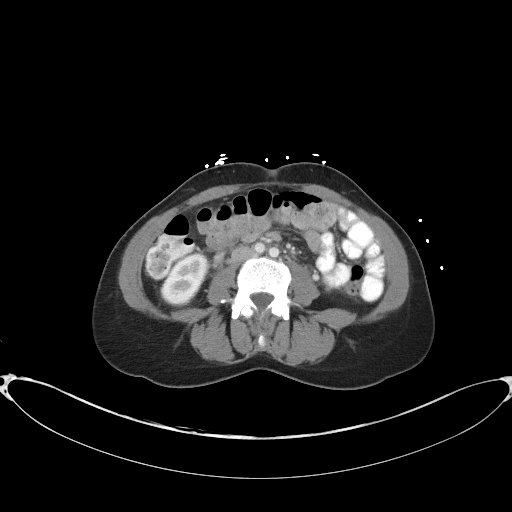
[im 54/89  soft-tissue]
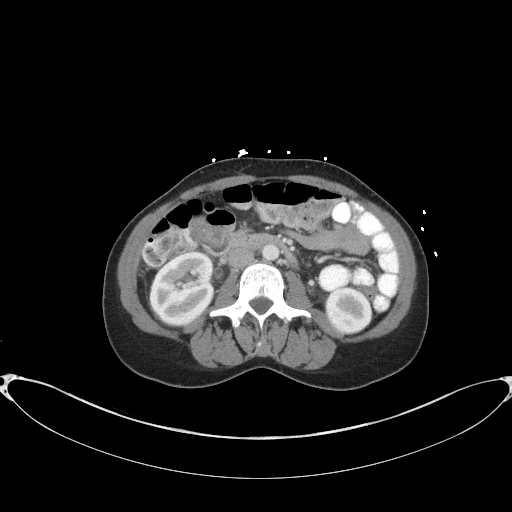
[im 64/89  soft-tissue]
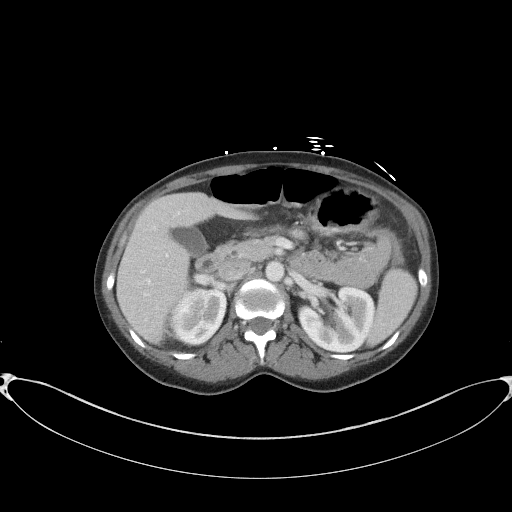
[im 64/89  bone]
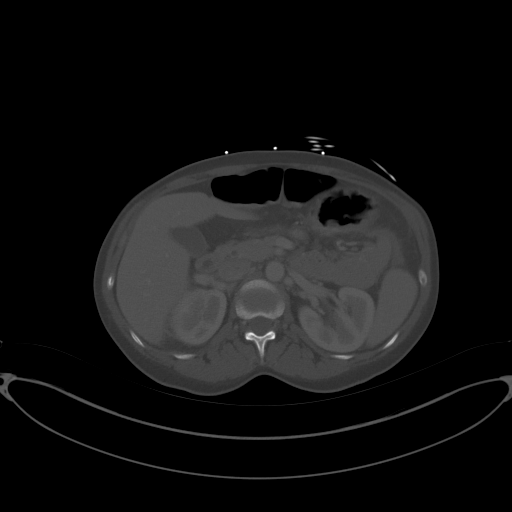
[im 69/89  soft-tissue]
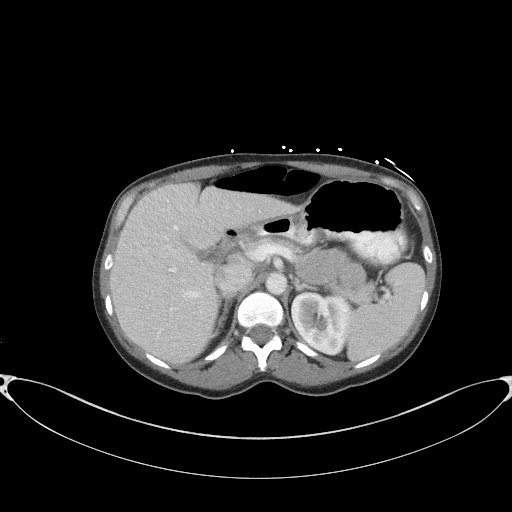
[im 74/89  soft-tissue]
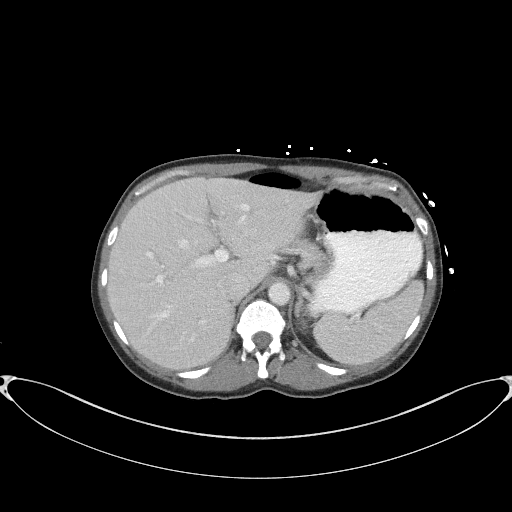
[im 84/89  soft-tissue]
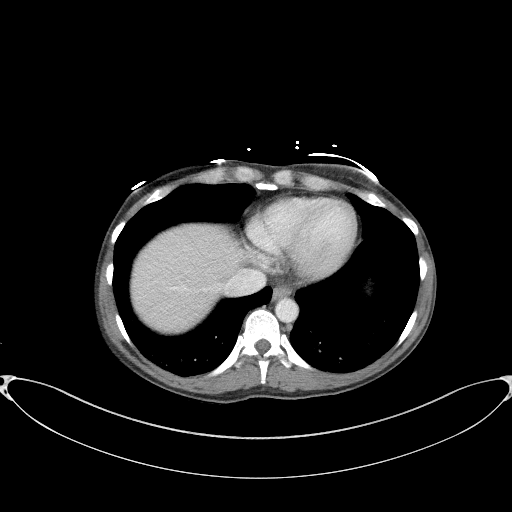

[Series 5: coronal st · coronal · 0.76mm/px · 3 of 70 slices shown]
[im 24/70  soft-tissue]
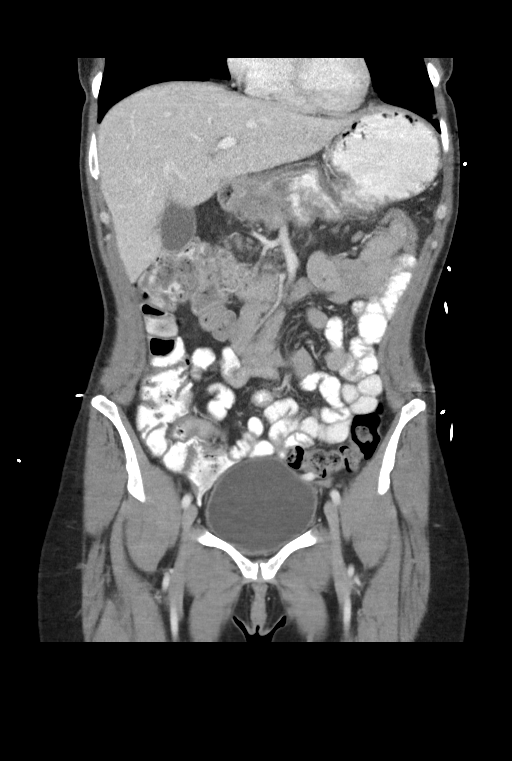
[im 31/70  soft-tissue]
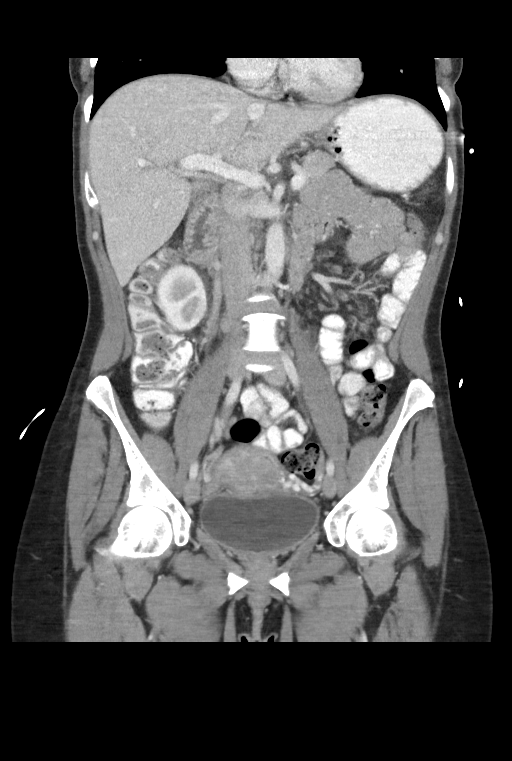
[im 39/70  soft-tissue]
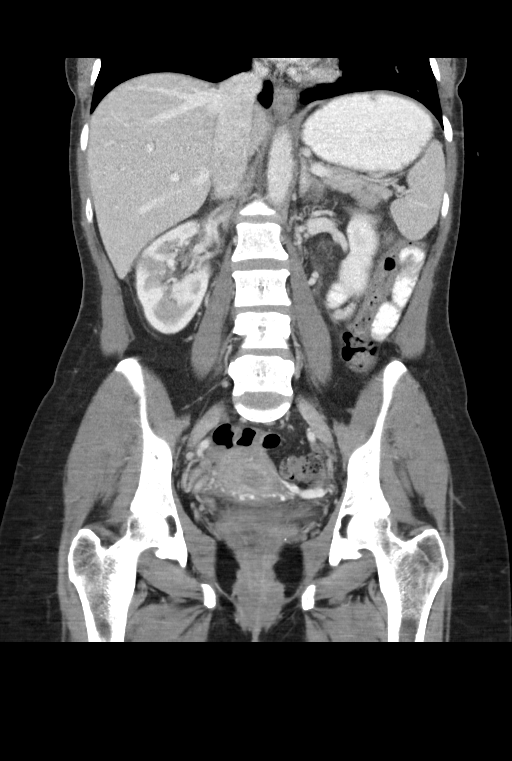

[15 of 46 positions shown; findings below may reference images not displayed]

FINDINGS: Lower chest: The visualized lung bases are grossly clear. The
visualized portions of the mediastinum are unremarkable.

Hepatobiliary: The liver is unremarkable in appearance. The
gallbladder is unremarkable in appearance. The common bile duct
remains normal in caliber.

Pancreas: The pancreas is within normal limits.

Spleen: The spleen is unremarkable in appearance.

Adrenals/Urinary Tract: The adrenal glands are unremarkable in
appearance.

Multiple foci of decreased enhancement are noted at the periphery of
the right kidney, compatible with multifocal right-sided
pyelonephritis. Surrounding perinephric stranding is noted. There is
wall thickening along the course of the right ureter and at the left
mid ureter, compatible with bilateral ureteritis.

There is no evidence of significant hydronephrosis. No renal or
ureteral stones are identified. A left renal cyst is noted.

Stomach/Bowel: The stomach is unremarkable in appearance. The small
bowel is within normal limits. The appendix is normal in caliber,
without evidence of appendicitis. The colon is unremarkable in
appearance.

Vascular/Lymphatic: The abdominal aorta is unremarkable in
appearance. The inferior vena cava is grossly unremarkable. No
retroperitoneal lymphadenopathy is seen. No pelvic sidewall
lymphadenopathy is identified.

Reproductive: The bladder is mildly distended and grossly
unremarkable. The uterus is unremarkable in appearance. The ovaries
are symmetric and grossly unremarkable in appearance. No suspicious
adnexal masses are seen.

Other: No additional soft tissue abnormalities are seen.

Musculoskeletal: No acute osseous abnormalities are identified. The
visualized musculature is unremarkable in appearance.
IMPRESSION: 1. Multifocal right-sided pyelonephritis noted. Wall thickening
along the courses of both ureters, compatible with bilateral
ureteritis.
2. Small left renal cyst noted.

## 2018-04-18 IMAGING — CR DG CHEST 2V
2 series · 2 of 2 positions shown · non-contrast
Comparison: None.

CLINICAL DATA: 45-year-old female with fever and chills. Possible
sepsis.

EXAM:
CHEST  2 VIEW

[w chest pa]
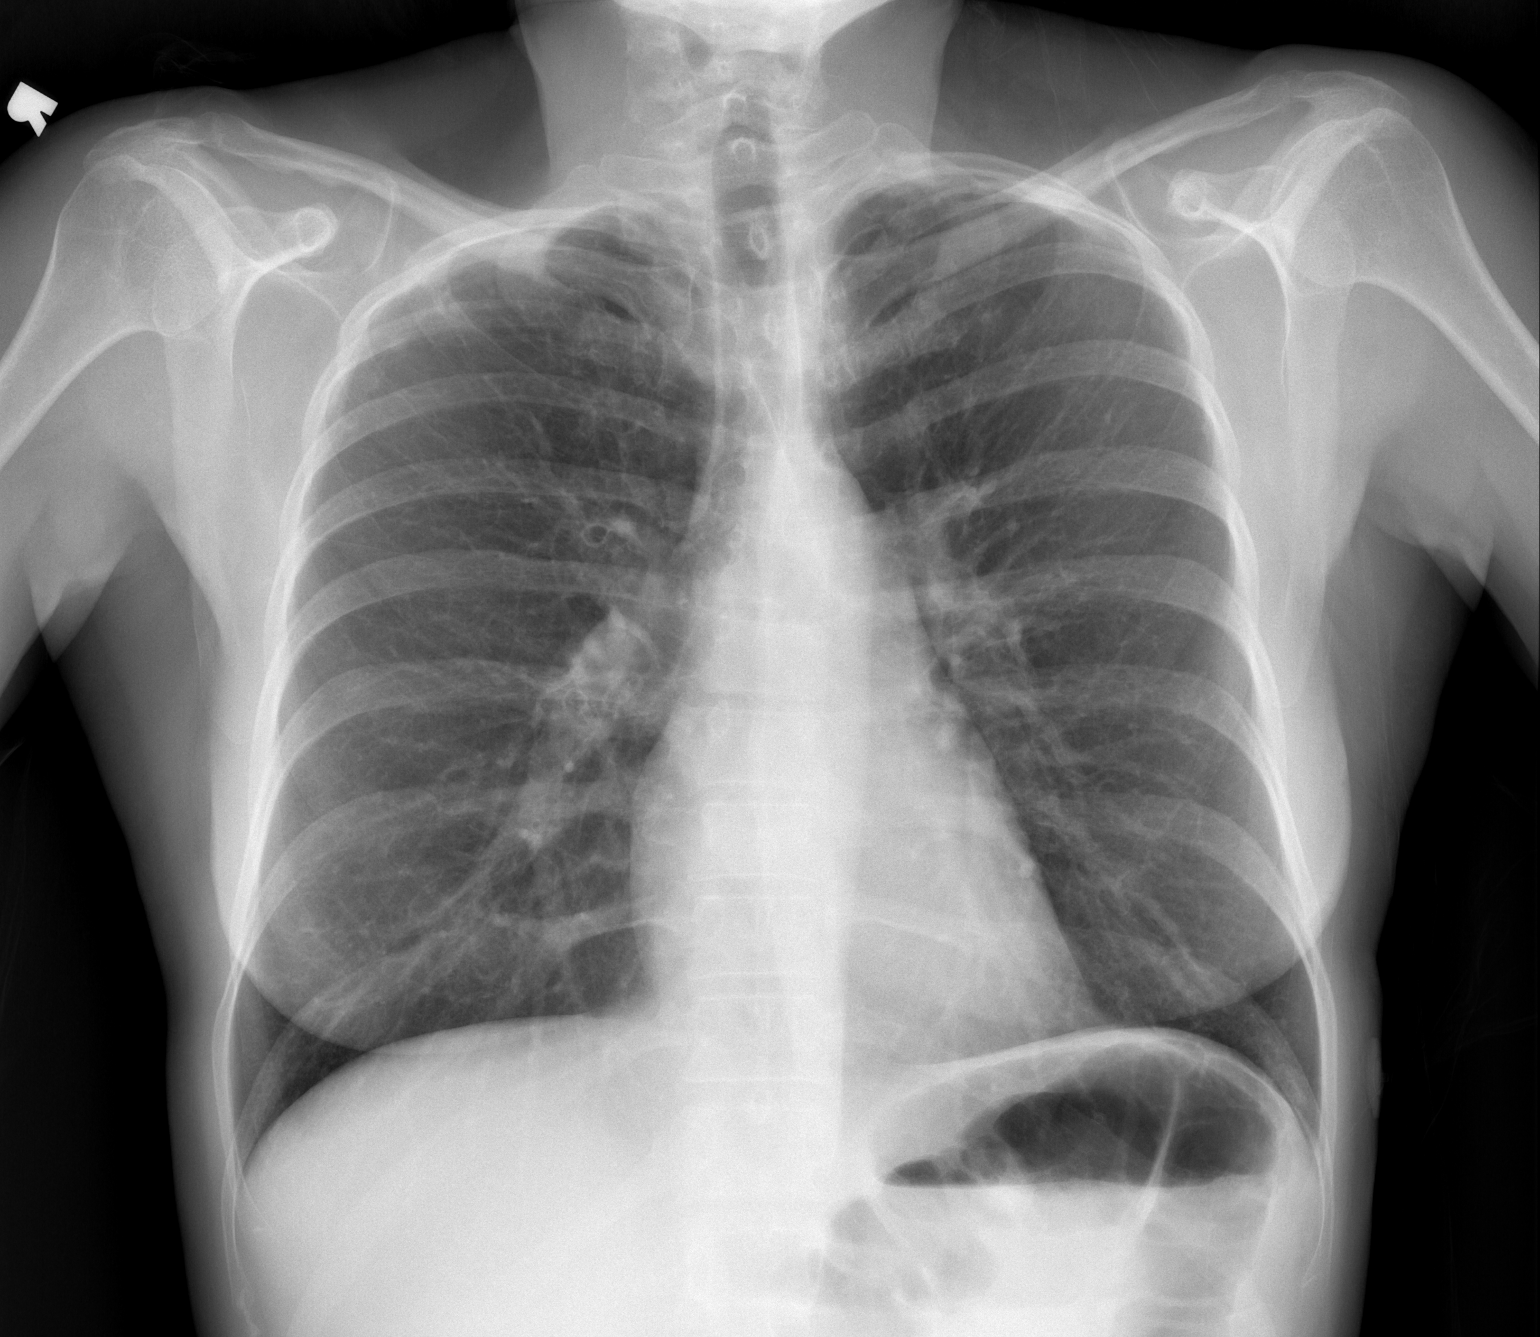

[w chest lat]
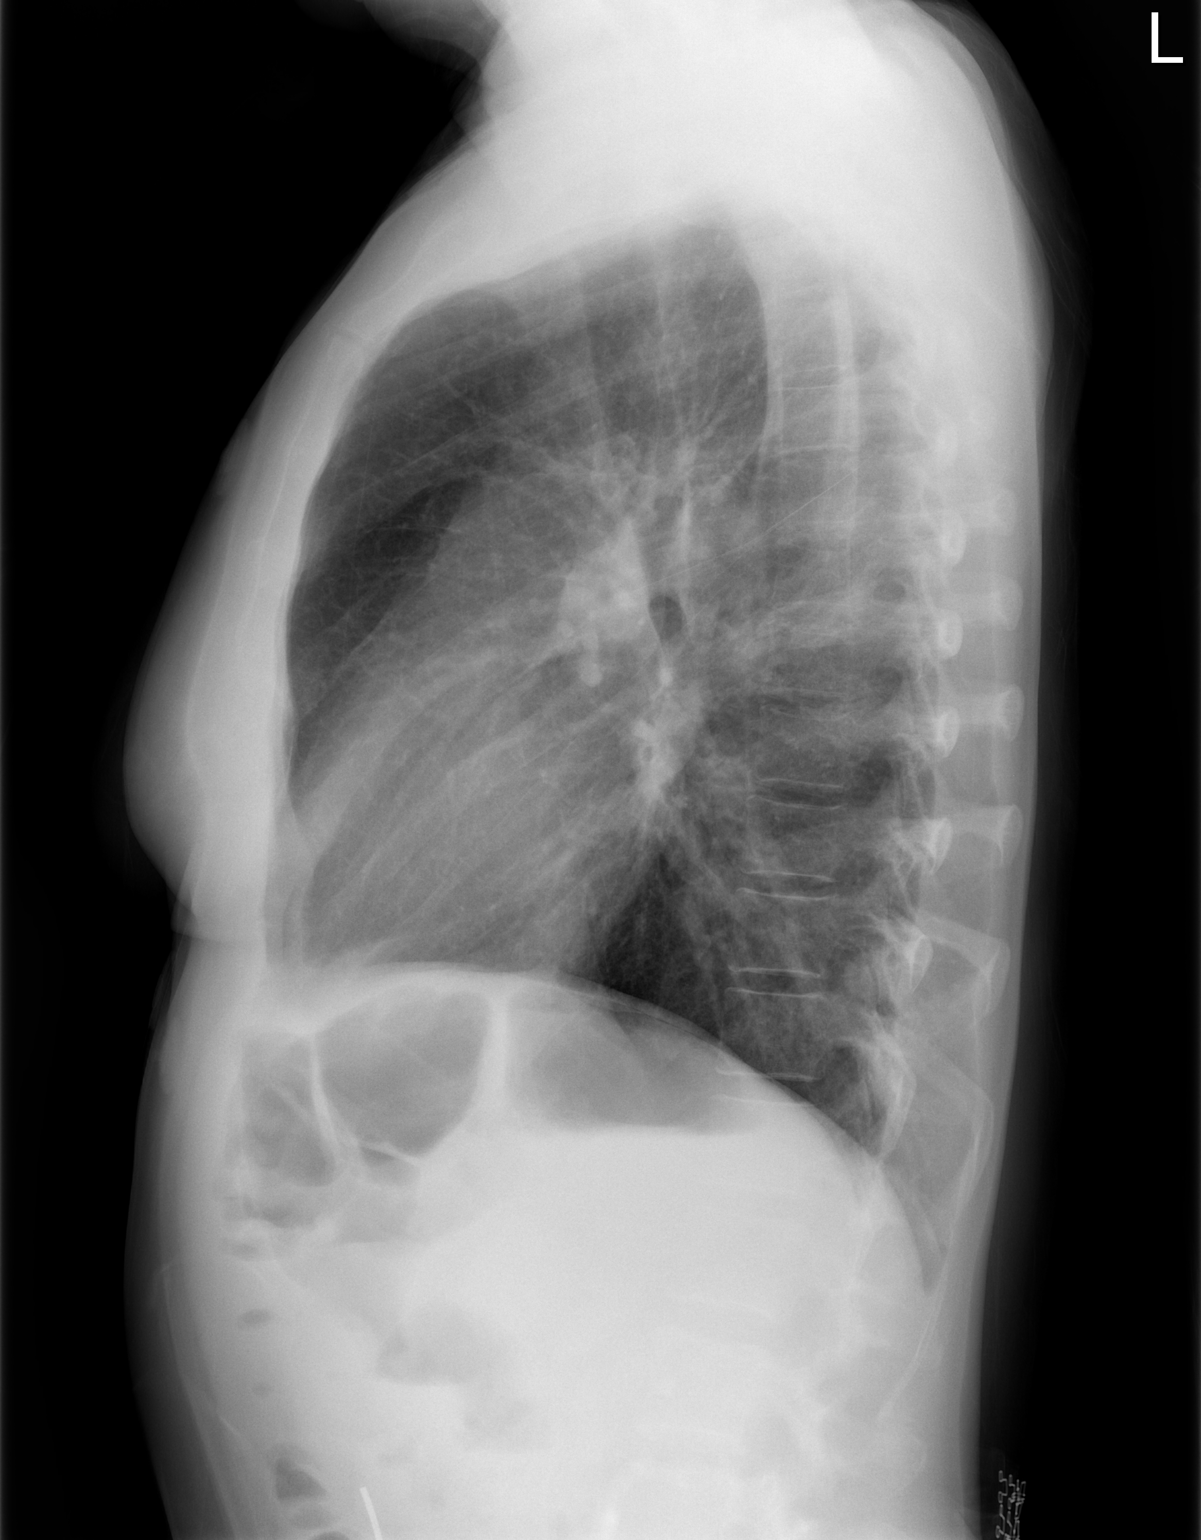

[2 of 2 positions shown; findings below may reference images not displayed]

FINDINGS: The heart size and mediastinal contours are within normal limits.
Both lungs are clear. The visualized skeletal structures are
unremarkable.
IMPRESSION: No active cardiopulmonary disease.

## 2019-07-28 ENCOUNTER — Other Ambulatory Visit: Payer: Self-pay

## 2019-07-28 ENCOUNTER — Encounter (HOSPITAL_BASED_OUTPATIENT_CLINIC_OR_DEPARTMENT_OTHER): Payer: Self-pay | Admitting: *Deleted

## 2019-07-28 ENCOUNTER — Emergency Department (HOSPITAL_BASED_OUTPATIENT_CLINIC_OR_DEPARTMENT_OTHER)
Admission: EM | Admit: 2019-07-28 | Discharge: 2019-07-28 | Disposition: A | Discharge status: Home / Self Care | Payer: PRIVATE HEALTH INSURANCE | Attending: Emergency Medicine | Admitting: Emergency Medicine

## 2019-07-28 DIAGNOSIS — Z79899 Other long term (current) drug therapy: Secondary | ICD-10-CM | POA: Insufficient documentation

## 2019-07-28 DIAGNOSIS — L02411 Cutaneous abscess of right axilla: Secondary | ICD-10-CM

## 2019-07-28 MED ORDER — LIDOCAINE-EPINEPHRINE (PF) 2 %-1:200000 IJ SOLN
10.0000 mL | Freq: Once | INTRAMUSCULAR | Status: AC
  Administered 2019-07-28: 10 mL

## 2019-07-28 MED ORDER — LIDOCAINE-EPINEPHRINE (PF) 2 %-1:200000 IJ SOLN
INTRAMUSCULAR | Status: AC
  Filled 2019-07-28: qty 10

## 2019-07-28 MED ORDER — SULFAMETHOXAZOLE-TRIMETHOPRIM 800-160 MG PO TABS: 1.0000 | ORAL_TABLET | Freq: Two times a day (BID) | ORAL | 0 refills | Status: AC

## 2019-07-28 NOTE — Discharge Instructions (Addendum)
Please pick up antibiotics and take as prescribed While at home please apply warm compresses to the area to allow for softening of remaining infection Return to the ED in 48 hours for packing removal.

## 2019-07-28 NOTE — ED Provider Notes (Signed)
[facility] EMERGENCY DEPARTMENT Provider Note   CSN: [id number] Arrival date & time: 07/28/19  1920     History Chief Complaint  Patient presents with  . Abscess    Lori Oneal is a 49 y.o. female who presents to the ED with complaint of area of redness, swelling, and pain to R axilla x 3-4 days. Pt reports area started as a small bump that has grown in size. She attempted to apply warm compress to the area however reports worsening pain since then. She attempted to pop the area herself however reports unsuccessful due to amount of pain. Has not been taking anything at home for pain. Denies fevers, chills, drainage, or any other associated symptoms.   The history is provided by the patient and medical records.       Past Medical History:  Diagnosis Date  . Bipolar disorder [facility])     Patient Active Problem List   Diagnosis Date Noted  . Acute pyelonephritis 09/13/2016  . Hyponatremia 09/13/2016    Past Surgical History:  Procedure Laterality Date  . TUBAL LIGATION       OB History   No obstetric history on file.     Family History  Problem Relation Age of Onset  . COPD Mother   . Diabetes Other     Social History   Tobacco Use  . Smoking status: Former Research scientist (life sciences)  . Smokeless tobacco: Never Used  Substance Use Topics  . Alcohol use: No  . Drug use: No    Home Medications Prior to Admission medications   Medication Sig Start Date End Date Taking? Authorizing Provider  estrogen, conjugated,-medroxyprogesterone (PREMPRO) 0.625-2.5 MG tablet Take 1 tablet by mouth daily.    [provider]  omeprazole (PRILOSEC) 40 MG capsule Take 40 mg by mouth daily.    [provider]  propranolol ER (INDERAL LA) 60 MG 24 hr capsule Take 1 capsule (60 mg total) by mouth daily. 08/28/17 08/28/18  Aundra Dubin, MD  sertraline (ZOLOFT) 100 MG tablet Take 100 mg daily for 2 weeks, then increase to 150 mg daily for 2 weeks, then 200 mg daily  08/27/17   Eksir, Richard Miu, MD  sulfamethoxazole-trimethoprim (BACTRIM DS) 800-160 MG tablet Take 1 tablet by mouth 2 (two) times daily for 7 days. 07/28/19 08/04/19  Eustaquio Maize, PA-C  traZODone (DESYREL) 100 MG tablet Take 1 tablet (100 mg total) by mouth at bedtime. 08/27/17   Eksir, Richard Miu, MD    Allergies    Patient has no known allergies.  Review of Systems   Review of Systems  Constitutional: Negative for chills and fever.  Musculoskeletal: Positive for arthralgias.  Skin: Positive for color change.    Physical Exam Updated Vital Signs BP 116/70 (BP Location: Left Arm)   Pulse 83   Temp 98.3 F (36.8 C) (Oral)   Resp 18   Ht 5' (1.524 m)   Wt 54 kg   SpO2 100%   BMI 23.24 kg/m   Physical Exam Vitals and nursing note reviewed.  Constitutional:      Appearance: She is not ill-appearing.  HENT:     Head: Normocephalic and atraumatic.  Eyes:     Conjunctiva/sclera: Conjunctivae normal.  Cardiovascular:     Rate and Rhythm: Normal rate and regular rhythm.  Pulmonary:     Effort: Pulmonary effort is normal.     Breath sounds: Normal breath sounds.  Skin:    General: Skin is warm and dry.  Coloration: Skin is not jaundiced.     Comments: 2 x 2 cm area of fluctuance, erythema, and edema noted to R axilla with TTP. No drainage appreciated.   Neurological:     Mental Status: She is alert.     ED Results / Procedures / Treatments   Labs (all labs ordered are listed, but only abnormal results are displayed) Labs Reviewed - No data to display  EKG None  Radiology No results found.  Procedures .Marland KitchenIncision and Drainage  Date/Time: 07/28/2019 9:56 PM Performed by: Tanda Rockers, PA-C Authorized by: Tanda Rockers, PA-C   Consent:    Consent obtained:  Verbal   Consent given by:  Patient   Risks discussed:  Incomplete drainage, bleeding and infection Location:    Type:  Abscess   Size:  2 x 2   Location:  Upper extremity   Upper  extremity location:  Arm   Arm location: R axilla. Pre-procedure details:    Skin preparation:  Betadine Anesthesia (see MAR for exact dosages):    Anesthesia method:  Local infiltration   Local anesthetic:  Lidocaine 2% WITH epi Procedure type:    Complexity:  Simple Procedure details:    Needle aspiration: no     Incision types:  Stab incision   Incision depth:  Submucosal   Scalpel blade:  11   Wound management:  Probed and deloculated and irrigated with saline   Drainage:  Purulent   Drainage amount:  Scant   Wound treatment:  Drain placed   Packing materials:  1/4 in gauze Post-procedure details:    Patient tolerance of procedure:  Tolerated well, no immediate complications   (including critical care time)  Medications Ordered in ED Medications  lidocaine-EPINEPHrine (XYLOCAINE W/EPI) 2 %-1:200000 (PF) injection 10 mL (10 mLs Infiltration Given 07/28/19 2213)    ED Course  I have reviewed the triage vital signs and the nursing notes.  Pertinent labs & imaging results that were available during my care of the patient were reviewed by me and considered in my medical decision making (see chart for details).    MDM Rules/Calculators/A&P                      48 year old female presenting to the ED today with abscess to R axilla x 3-4 days which has grown in size. On arrival to the ED pt is afebrile, nontachycardic, and nontachypneic. She appears to be in NAD. She does have a 2 x 2 cm area of fluctuance to right axilla with TTP consistent with abscess. Will perform I&D at this time.   I&D performed with scant amount of purulent material. Area probbed and deloculated and irrigated with saline. Packing applied. Given small amount of purulence will discharge home with abx. Pt adivsed to return to the ED in 48 hours for packing removal and would recheck. She is in agreement with plan and stable for discharge home.   This note was prepared using Dragon voice recognition software and  may include unintentional dictation errors due to the inherent limitations of voice recognition software. Final Clinical Impression(s) / ED Diagnoses Final diagnoses:  Abscess of axilla, right    Rx / DC Orders ED Discharge Orders         Ordered    sulfamethoxazole-trimethoprim (BACTRIM DS) 800-160 MG tablet  2 times daily     07/28/19 2157           Discharge Instructions     Please  pick up antibiotics and take as prescribed While at home please apply warm compresses to the area to allow for softening of remaining infection Return to the ED in 48 hours for packing removal.        Tanda Rockers, PA-C 07/28/19 2221    Long, Arlyss Repress, MD 07/29/19 1341

## 2019-07-28 NOTE — ED Triage Notes (Signed)
Abscess under right arm x 3 days.

## 2022-11-15 ENCOUNTER — Ambulatory Visit (INDEPENDENT_AMBULATORY_CARE_PROVIDER_SITE_OTHER): Payer: BC Managed Care – PPO | Admitting: Psychology

## 2022-11-15 DIAGNOSIS — F331 Major depressive disorder, recurrent, moderate: Secondary | ICD-10-CM
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
## 2022-11-30 ENCOUNTER — Ambulatory Visit (INDEPENDENT_AMBULATORY_CARE_PROVIDER_SITE_OTHER): Payer: BC Managed Care – PPO | Admitting: Psychology

## 2022-11-30 DIAGNOSIS — F331 Major depressive disorder, recurrent, moderate: Secondary | ICD-10-CM | POA: Diagnosis not present

## 2022-11-30 NOTE — Progress Notes (Signed)
[facility] Counselor/Therapist Progress Note  Patient ID: Lori Oneal, MRN: [id number],    Date: 11/30/2022  Time Spent: 4:00pm-4:50pm   50 minutes   Treatment Type: Individual Therapy  Reported Symptoms: stress, anger  Mental Status Exam: Appearance:  Casual     Behavior: Appropriate  Motor: Normal  Speech/Language:  Normal Rate  Affect: Appropriate  Mood: normal  Thought process: normal  Thought content:   WNL  Sensory/Perceptual disturbances:   WNL  Orientation: oriented to person, place, time/date, and situation  Attention: Good  Concentration: Good  Memory: WNL  Fund of knowledge:  Good  Insight:   Fair  Judgment:  Good  Impulse Control: Fair   Risk Assessment: Danger to Self:  No Self-injurious Behavior: No Danger to Others: No Duty to Warn:no Physical Aggression / Violence:No  Access to Firearms a concern: No  Gang Involvement:No   Subjective: Pt present for face-to-face individual therapy via video.  Pt consents to telehealth video session and is aware of limitations and benefits of virtual sessions. Location of pt: home Location of therapist: home office.   Pt states she feels empty on the inside.   She feels like she does not want to be bothered with anyone.   Pt has been feeling this way for several months.   She also has episodic anger blowups.   Pt talked about working 2 jobs after her divorce.  All the time working hurt her relationship with her daughter in Social worker.   Pt distanced herself bc she was so tired.  They use to have daily phone calls.  Pt wants to rekindle the relationship but her daughter in law does not seem to.  Helped pt process her feelings and relationship dynamics.  Pt feels badly about herself.   She thinks people should always be there for her even when she does not treat them well.   At times pt is a negative person.   Pt states her anger comes up so quickly she doesn't even have time to take a time out or use other anger  management coping skills.  Pt realizes she needs to get help to have any healthy relationship.   She and her boyfriend have broken up bc of her anger.    Pt states she is motivated this time to really get help.  Made referral for pt to see a psychiatrist at [facility].   Provided supportive therapy.    Interventions: Cognitive Behavioral Therapy and Insight-Oriented  Diagnosis:  F33.1  Plan of Care: Recommend ongoing therapy.  Pt participated in setting treatment goals and is in agreement with treatment plan.   Pt wants to improve coping skills and reduce angry outbursts.  Plan to meet every two weeks.   Treatment Plan Client Abilities/Strengths  Pt is engaging, and motivated for therapy.   Client Treatment Preferences  Individual therapy.  Client Statement of Needs  Improve coping skills.  Symptoms  Depressed or irritable mood. Agitation  Problems Addressed  Unipolar Depression Goals 1. Alleviate depressive symptoms and return to previous level of effective functioning. 2. Appropriately grieve the loss in order to normalize mood and to return to previously adaptive level of functioning. Objective Learn and implement behavioral strategies to overcome depression. Target Date: 2023-11-15 Frequency: Biweekly  Progress: 10 Modality: individual  Related Interventions Engage the client in "behavioral activation," increasing his/her activity level and contact with sources of reward, while identifying processes that inhibit activation.  Use behavioral techniques such as instruction, rehearsal, role-playing,  role reversal, as needed, to facilitate activity in the client's daily life; reinforce success. Assist the client in developing skills that increase the likelihood of deriving pleasure from behavioral activation (e.g., assertiveness skills, developing an exercise plan, less internal/more external focus, increased social involvement); reinforce  success. Objective Identify important people in life, past and present, and describe the quality, good and poor, of those relationships. Target Date: 2023-11-15 Frequency: Biweekly  Progress: 10 Modality: individual  Related Interventions Conduct Interpersonal Therapy beginning with the assessment of the client's "interpersonal inventory" of important past and present relationships; develop a case formulation linking depression to grief, interpersonal role disputes, role transitions, and/or interpersonal deficits). Objective Learn and implement problem-solving and decision-making skills. Target Date: 2023-11-15 Frequency: Biweekly  Progress: 10 Modality: individual  Related Interventions Conduct Problem-Solving Therapy using techniques such as psychoeducation, modeling, and role-playing to teach client problem-solving skills (i.e., defining a problem specifically, generating possible solutions, evaluating the pros and cons of each solution, selecting and implementing a plan of action, evaluating the efficacy of the plan, accepting or revising the plan); role-play application of the problem-solving skill to a real life issue. Encourage in the client the development of a positive problem orientation in which problems and solving them are viewed as a natural part of life and not something to be feared, despaired, or avoided. 3. Develop healthy interpersonal relationships that lead to the alleviation and help prevent the relapse of depression. 4. Develop healthy thinking patterns and beliefs about self, others, and the world that lead to the alleviation and help prevent the relapse of depression. 5. Recognize, accept, and cope with feelings of depression. Diagnosis F33.1  Conditions For Discharge Achievement of treatment goals and objectives   Salomon Fick, LCSW

## 2022-12-20 ENCOUNTER — Ambulatory Visit: Payer: BC Managed Care – PPO | Admitting: Psychology

## 2022-12-20 DIAGNOSIS — F331 Major depressive disorder, recurrent, moderate: Secondary | ICD-10-CM | POA: Diagnosis not present

## 2022-12-20 NOTE — Progress Notes (Signed)
[facility] Counselor/Therapist Progress Note  Patient ID: Lori Oneal, MRN: [id number],    Date: 12/20/2022  Time Spent: 5:00pm-5:45pm   45 minutes   Treatment Type: Individual Therapy  Reported Symptoms: stress, anger  Mental Status Exam: Appearance:  Casual     Behavior: Appropriate  Motor: Normal  Speech/Language:  Normal Rate  Affect: Appropriate  Mood: normal  Thought process: normal  Thought content:   WNL  Sensory/Perceptual disturbances:   WNL  Orientation: oriented to person, place, time/date, and situation  Attention: Good  Concentration: Good  Memory: WNL  Fund of knowledge:  Good  Insight:   Fair  Judgment:  Good  Impulse Control: Fair   Risk Assessment: Danger to Self:  No Self-injurious Behavior: No Danger to Others: No Duty to Warn:no Physical Aggression / Violence:No  Access to Firearms a concern: No  Gang Involvement:No   Subjective: Pt present for face-to-face individual therapy via video.  Pt consents to telehealth video session and is aware of limitations and benefits of virtual sessions. Location of pt: home Location of therapist: home office.   Pt talked about working on keeping herself calm.  Pt is trying to walk away when she feels angry and that time out has helped.  She states she still feels empty.   Pt feels like she wants to be alone most of the time.   She is upset she feels like that this.   Pt feels badly about herself.   She thinks people should always be there for her even when she does not treat them well.   At times pt is a negative person.   Pt made an appointment for October 25th to see Corie Chiquito at [facility].  Pt talked about family relationships.   She is trying to mend the relationship with her daughter in law.  Helped pt process relationship dynamics.     Worked on Pharmacologist.  Provided supportive therapy.    Interventions: Cognitive Behavioral Therapy and  Insight-Oriented  Diagnosis:  F33.1  Plan of Care: Recommend ongoing therapy.  Pt participated in setting treatment goals and is in agreement with treatment plan.   Pt wants to improve coping skills and reduce angry outbursts.  Plan to meet every two weeks.   Treatment Plan Client Abilities/Strengths  Pt is engaging, and motivated for therapy.   Client Treatment Preferences  Individual therapy.  Client Statement of Needs  Improve coping skills.  Symptoms  Depressed or irritable mood. Agitation  Problems Addressed  Unipolar Depression Goals 1. Alleviate depressive symptoms and return to previous level of effective functioning. 2. Appropriately grieve the loss in order to normalize mood and to return to previously adaptive level of functioning. Objective Learn and implement behavioral strategies to overcome depression. Target Date: 2023-11-15 Frequency: Biweekly  Progress: 10 Modality: individual  Related Interventions Engage the client in "behavioral activation," increasing his/her activity level and contact with sources of reward, while identifying processes that inhibit activation.  Use behavioral techniques such as instruction, rehearsal, role-playing, role reversal, as needed, to facilitate activity in the client's daily life; reinforce success. Assist the client in developing skills that increase the likelihood of deriving pleasure from behavioral activation (e.g., assertiveness skills, developing an exercise plan, less internal/more external focus, increased social involvement); reinforce success. Objective Identify important people in life, past and present, and describe the quality, good and poor, of those relationships. Target Date: 2023-11-15 Frequency: Biweekly  Progress: 10 Modality: individual  Related Interventions Conduct Interpersonal Therapy  beginning with the assessment of the client's "interpersonal inventory" of important past and present relationships; develop a  case formulation linking depression to grief, interpersonal role disputes, role transitions, and/or interpersonal deficits). Objective Learn and implement problem-solving and decision-making skills. Target Date: 2023-11-15 Frequency: Biweekly  Progress: 10 Modality: individual  Related Interventions Conduct Problem-Solving Therapy using techniques such as psychoeducation, modeling, and role-playing to teach client problem-solving skills (i.e., defining a problem specifically, generating possible solutions, evaluating the pros and cons of each solution, selecting and implementing a plan of action, evaluating the efficacy of the plan, accepting or revising the plan); role-play application of the problem-solving skill to a real life issue. Encourage in the client the development of a positive problem orientation in which problems and solving them are viewed as a natural part of life and not something to be feared, despaired, or avoided. 3. Develop healthy interpersonal relationships that lead to the alleviation and help prevent the relapse of depression. 4. Develop healthy thinking patterns and beliefs about self, others, and the world that lead to the alleviation and help prevent the relapse of depression. 5. Recognize, accept, and cope with feelings of depression. Diagnosis F33.1  Conditions For Discharge Achievement of treatment goals and objectives   Salomon Fick, LCSW

## 2023-01-03 ENCOUNTER — Ambulatory Visit: Payer: BC Managed Care – PPO | Admitting: Psychology

## 2023-01-03 DIAGNOSIS — F331 Major depressive disorder, recurrent, moderate: Secondary | ICD-10-CM

## 2023-01-19 ENCOUNTER — Ambulatory Visit (INDEPENDENT_AMBULATORY_CARE_PROVIDER_SITE_OTHER): Payer: BC Managed Care – PPO | Admitting: Psychology

## 2023-01-19 ENCOUNTER — Ambulatory Visit (INDEPENDENT_AMBULATORY_CARE_PROVIDER_SITE_OTHER): Payer: BC Managed Care – PPO | Admitting: Psychiatry

## 2023-01-19 DIAGNOSIS — F331 Major depressive disorder, recurrent, moderate: Secondary | ICD-10-CM | POA: Diagnosis not present

## 2023-01-19 NOTE — Progress Notes (Signed)
[facility] Counselor/Therapist Progress Note  Patient ID: Lori Oneal, MRN: [id number],    Date: 01/19/2023  Time Spent: 9:00am-9:45am   45 minutes   Treatment Type: Individual Therapy  Reported Symptoms: stress, anger  Mental Status Exam: Appearance:  Casual     Behavior: Appropriate  Motor: Normal  Speech/Language:  Normal Rate  Affect: Appropriate  Mood: normal  Thought process: normal  Thought content:   WNL  Sensory/Perceptual disturbances:   WNL  Orientation: oriented to person, place, time/date, and situation  Attention: Good  Concentration: Good  Memory: WNL  Fund of knowledge:  Good  Insight:   Fair  Judgment:  Good  Impulse Control: Fair   Risk Assessment: Danger to Self:  No Self-injurious Behavior: No Danger to Others: No Duty to Warn:no Physical Aggression / Violence:No  Access to Firearms a concern: No  Gang Involvement:No   Subjective: Pt present for face-to-face individual therapy via video.  Pt consents to telehealth video session and is aware of limitations and benefits of virtual sessions. Location of pt: home Location of therapist: home office.   Pt talked about working on keeping herself calm.  Pt is trying to walk away when she feels angry and that time out has helped.  She states she still feels empty.    She is upset she feels like that this.   Pt will see a psychiatrist next week at [facility].  Pt is hoping that she will be prescribed medication that will help her.  Pt talked about family relationships.   She is still trying to mend the relationship with her daughter in law.  Helped pt process relationship dynamics.     Pt is working 2 jobs and doesn't have the time with family she would like to have.   She plans to move in with her boyfriend in January and is looking forward to that since they have been dating for a couple of years.   Worked on Pharmacologist.  Provided supportive therapy.     Interventions: Cognitive Behavioral Therapy and Insight-Oriented  Diagnosis:  F33.1  Plan of Care: Recommend ongoing therapy.  Pt participated in setting treatment goals and is in agreement with treatment plan.   Pt wants to improve coping skills and reduce angry outbursts.  Plan to meet every two weeks.   Treatment Plan Client Abilities/Strengths  Pt is engaging, and motivated for therapy.   Client Treatment Preferences  Individual therapy.  Client Statement of Needs  Improve coping skills.  Symptoms  Depressed or irritable mood. Agitation  Problems Addressed  Unipolar Depression Goals 1. Alleviate depressive symptoms and return to previous level of effective functioning. 2. Appropriately grieve the loss in order to normalize mood and to return to previously adaptive level of functioning. Objective Learn and implement behavioral strategies to overcome depression. Target Date: 2023-11-15 Frequency: Biweekly  Progress: 10 Modality: individual  Related Interventions Engage the client in "behavioral activation," increasing his/her activity level and contact with sources of reward, while identifying processes that inhibit activation.  Use behavioral techniques such as instruction, rehearsal, role-playing, role reversal, as needed, to facilitate activity in the client's daily life; reinforce success. Assist the client in developing skills that increase the likelihood of deriving pleasure from behavioral activation (e.g., assertiveness skills, developing an exercise plan, less internal/more external focus, increased social involvement); reinforce success. Objective Identify important people in life, past and present, and describe the quality, good and poor, of those relationships. Target Date: 2023-11-15 Frequency: Biweekly  Progress: 10 Modality: individual  Related Interventions Conduct Interpersonal Therapy beginning with the assessment of the client's "interpersonal inventory" of  important past and present relationships; develop a case formulation linking depression to grief, interpersonal role disputes, role transitions, and/or interpersonal deficits). Objective Learn and implement problem-solving and decision-making skills. Target Date: 2023-11-15 Frequency: Biweekly  Progress: 10 Modality: individual  Related Interventions Conduct Problem-Solving Therapy using techniques such as psychoeducation, modeling, and role-playing to teach client problem-solving skills (i.e., defining a problem specifically, generating possible solutions, evaluating the pros and cons of each solution, selecting and implementing a plan of action, evaluating the efficacy of the plan, accepting or revising the plan); role-play application of the problem-solving skill to a real life issue. Encourage in the client the development of a positive problem orientation in which problems and solving them are viewed as a natural part of life and not something to be feared, despaired, or avoided. 3. Develop healthy interpersonal relationships that lead to the alleviation and help prevent the relapse of depression. 4. Develop healthy thinking patterns and beliefs about self, others, and the world that lead to the alleviation and help prevent the relapse of depression. 5. Recognize, accept, and cope with feelings of depression. Diagnosis F33.1  Conditions For Discharge Achievement of treatment goals and objectives   Salomon Fick, LCSW                 Neetu Carrozza Culver, LCSW

## 2023-01-26 ENCOUNTER — Encounter: Payer: Self-pay | Admitting: Adult Health

## 2023-01-26 ENCOUNTER — Ambulatory Visit (INDEPENDENT_AMBULATORY_CARE_PROVIDER_SITE_OTHER): Payer: BC Managed Care – PPO | Admitting: Adult Health

## 2023-01-26 ENCOUNTER — Ambulatory Visit: Payer: BC Managed Care – PPO | Admitting: Psychology

## 2023-01-26 VITALS — BP 136/84 | HR 77 | Ht 69.0 in | Wt 227.0 lb

## 2023-01-26 DIAGNOSIS — F411 Generalized anxiety disorder: Secondary | ICD-10-CM | POA: Diagnosis not present

## 2023-01-26 DIAGNOSIS — F39 Unspecified mood [affective] disorder: Secondary | ICD-10-CM | POA: Diagnosis not present

## 2023-01-26 MED ORDER — BUSPIRONE HCL 10 MG PO TABS: ORAL_TABLET | ORAL | 2 refills | Status: DC

## 2023-01-26 MED ORDER — RISPERIDONE 0.5 MG PO TABS: 0.5000 mg | ORAL_TABLET | Freq: Every day | ORAL | 2 refills | Status: DC

## 2023-01-26 NOTE — Progress Notes (Signed)
[facility] MD/PA/NP Initial Note  01/26/2023 12:47 PM Lori Oneal  MRN:  [id number]  Chief Complaint:   HPI:   Patient seen today for initial psychiatric evaluation.   Previously seen by Dr. Rene Kocher and PCP for medication management.  Describes mood today as "not too good". Pleasant. Denies tearfulness. Mood symptoms - reports feeling depressed sometimes, but doesn't know why. Reports anxiety a lot of the time.Reports irritability. Reports "blowing" up at people and not sure why. Gets easily frustrated with change. Denies panic attacks. Reports worrying about her family. Reports rumination and over thinking. Reports a decreased interest and motivation.  Reports extreme happiness, hopefulness, and excitement. Reports Irritability, anger, fits of rage and hostile behavior. Reports restlessness sometimes. Reports extreme agitation. Reports rapid speech. Reports poor concentration and judgment sometimes. Reports increased energy. Reports some sadness and crying Reports feelings of hopelessness, worthlessness, and guilt. Reports a loss of interest or pleasure in everyday activities sometimes. Reports frequent issues with concentrating and making decisions. Reports changes in appetite - weight loss/gain. Reports mood swings. Feels like mood fluctuates all the time. Has backed off from everyone  - children - grandchildren and friends. Divorced husband 2 years ago - married for 34 years - he became an addict. Reports working 2 jobs - 7 days a week. Stating "I don't feel like I have any emotions right now". Taking current medications as prescribed, but is willing to consider other options..  Energy levels stable - "too much". Active, does not have a regular exercise routine.  Enjoys some usual interests and activities. Lives alone. Has a boyfriend of 2 and 1/2 years. Has 3 children and 4 grandchildren. Spending time with family. Appetite decreased. Reports weight loss - 30 pounds over past 9 months.  Sleeps  well most nights. Averages 7 to 8 hours. Focus and concentration stable. Completing tasks. Managing aspects of household. 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  Denies AH or VH. Denies self harm.  Reports smoking THC weekly. Reports alcohol use every once in a while.  Previous medication trials:  Lamictal, Abilify, Seroquel, Wellbutrin, Zoloft  Visit Diagnosis:    ICD-10-CM   1. Episodic mood disorder (HCC)  F39 risperiDONE (RISPERDAL) 0.5 MG tablet    2. Generalized anxiety disorder  F41.1 busPIRone (BUSPAR) 10 MG tablet      Past Psychiatric History: Denies psychiatric hospitalization.   Past Medical History:  Past Medical History:  Diagnosis Date   Bipolar disorder [facility])     Past Surgical History:  Procedure Laterality Date   TUBAL LIGATION      Family Psychiatric History: Reports family history of mental illness.   Family History:  Family History  Problem Relation Age of Onset   COPD Mother    Diabetes Other     Social History:  Social History   Socioeconomic History   Marital status: Married    Spouse name: [name]   Number of children: 3   Years of education: Not on file   Highest education level: 10th grade  Occupational History   Not on file  Tobacco Use   Smoking status: Former   Smokeless tobacco: Never  Vaping Use   Vaping status: Never Used  Substance and Sexual Activity   Alcohol use: No   Drug use: No   Sexual activity: Yes    Birth control/protection: Surgical  Other Topics Concern   Not on file  Social History Narrative   Not on file   Social Determinants of Health  Financial Resource Strain: Low Risk  (01/29/2017)   Overall Financial Resource Strain (CARDIA)    Difficulty of Paying Living Expenses: Not very hard  Food Insecurity: No Food Insecurity (01/29/2017)   Hunger Vital Sign    Worried About Running Out of Food in the Last Year: Never true    Ran Out of Food in the Last Year: Never true  Transportation Needs: No  Transportation Needs (12/25/2017)   Received from [facility] visits prior to 05/27/2022.   PRAPARE - Administrator, Civil Service (Medical): No    Lack of Transportation (Non-Medical): No  Physical Activity: Inactive (01/29/2017)   Exercise Vital Sign    Days of Exercise per Week: 0 days    Minutes of Exercise per Session: 0 min  Stress: Stress Concern Present (01/29/2017)   [employer] of Occupational Health - Occupational Stress Questionnaire    Feeling of Stress : Very much  Social Connections: Moderately Isolated (01/29/2017)   Social Connection and Isolation Panel [NHANES]    Frequency of Communication with Friends and Family: Once a week    Frequency of Social Gatherings with Friends and Family: Once a week    Attends Religious Services: Never    Database administrator or Organizations: No    Attends Engineer, structural: Never    Marital Status: Married    Allergies: No Known Allergies  Metabolic Disorder Labs: No results found for: "HGBA1C", "MPG" No results found for: "PROLACTIN" No results found for: "CHOL", "TRIG", "HDL", "CHOLHDL", "VLDL", "LDLCALC" No results found for: "TSH"  Therapeutic Level Labs: No results found for: "LITHIUM" No results found for: "VALPROATE" No results found for: "CBMZ"  Current Medications: Current Outpatient Medications  Medication Sig Dispense Refill   busPIRone (BUSPAR) 10 MG tablet Take 1/2 tablet three times daily for 7 days, then increase to three times daily. 90 tablet 2   risperiDONE (RISPERDAL) 0.5 MG tablet Take 1 tablet (0.5 mg total) by mouth at bedtime. 30 tablet 2   estrogen, conjugated,-medroxyprogesterone (PREMPRO) 0.625-2.5 MG tablet Take 1 tablet by mouth daily.     omeprazole (PRILOSEC) 40 MG capsule Take 40 mg by mouth daily.     propranolol ER (INDERAL LA) 60 MG 24 hr capsule Take 1 capsule (60 mg total) by mouth daily. 90 capsule 0   sertraline (ZOLOFT) 100 MG tablet Take  100 mg daily for 2 weeks, then increase to 150 mg daily for 2 weeks, then 200 mg daily 180 tablet 0   traZODone (DESYREL) 100 MG tablet Take 1 tablet (100 mg total) by mouth at bedtime. 90 tablet 0   No current facility-administered medications for this visit.    Medication Side Effects: none  Orders placed this visit:  No orders of the defined types were placed in this encounter.   Psychiatric Specialty Exam:  Review of Systems  Musculoskeletal:  Negative for gait problem.  Neurological:  Negative for tremors.  Psychiatric/Behavioral:         Please refer to HPI    Blood pressure 136/84, pulse 77, height 5\' 9"  (1.753 m), weight 227 lb (103 kg).Body mass index is 33.52 kg/m.  General Appearance: Casual and Neat  Eye Contact:  Good  Speech:  Clear and Coherent and Normal Rate  Volume:  Normal  Mood:  Anxious, Depressed, and Irritable  Affect:  Appropriate and Congruent  Thought Process:  Coherent and Descriptions of Associations: Intact  Orientation:  Full (Time, Place, and Person)  Thought Content: Logical   Suicidal Thoughts:  No  Homicidal Thoughts:  No  Memory:  WNL  Judgement:  Good  Insight:  Good  Psychomotor Activity:  Normal  Concentration:  Concentration: Good and Attention Span: Good  Recall:  Good  Fund of Knowledge: Good  Language: Good  Assets:  Communication Skills Desire for Improvement Financial Resources/Insurance Housing Intimacy Leisure Time Physical Health Resilience Social Support Talents/Skills Transportation Vocational/Educational  ADL's:  Intact  Cognition: WNL  Prognosis:  Good   Screenings:   Receiving Psychotherapy: Yes   Treatment Plan/Recommendations:   Plan:  PDMP reviewed  Add Risperdal 0.5mg  daily.  Increase Buspar 5mg  BID to 10mg  TID  Paxil 40mg  daily  Trazadone 50mg  at hs  Will review previous collateral.    RTC 4 weeks  Patient advised to contact office with any questions, adverse effects, or acute  worsening in signs and symptoms.   Discussed potential metabolic side effects associated with atypical antipsychotics, as well as potential risk for movement side effects. Advised pt to contact office if movement side effects occur.     Dorothyann Gibbs, NP

## 2023-02-07 ENCOUNTER — Encounter: Payer: Self-pay | Admitting: Psychiatry

## 2023-02-14 ENCOUNTER — Telehealth: Payer: Self-pay | Admitting: Adult Health

## 2023-02-14 NOTE — Telephone Encounter (Signed)
Lori Oneal called yesterday at 4:45 and LM that she cannot tolerate the increase of the Buspar.  Please call to discuss.  Next appt 12/6

## 2023-02-15 NOTE — Telephone Encounter (Signed)
Patient only seen once on 11/1.  Increase Buspar 5mg  BID to 10mg  TID. She said she is unable to tolerate the 30 mg dose of Buspar. She said she feels bad and is dizzy. Should she drop back in dose? Has FU 12/6.

## 2023-02-15 NOTE — Telephone Encounter (Signed)
LVM per DPR to reduce Buspar to 10 mg BID and to let us know next week how she was doing.

## 2023-02-16 ENCOUNTER — Ambulatory Visit (INDEPENDENT_AMBULATORY_CARE_PROVIDER_SITE_OTHER): Payer: BC Managed Care – PPO | Admitting: Psychology

## 2023-02-16 DIAGNOSIS — F331 Major depressive disorder, recurrent, moderate: Secondary | ICD-10-CM | POA: Diagnosis not present

## 2023-02-16 NOTE — Progress Notes (Signed)
[facility] Counselor/Therapist Progress Note  Patient ID: Ciarrah Pettersson, MRN: [id number],    Date: 02/16/2023  Time Spent: 1:00pm-1:45pm   45 minutes   Treatment Type: Individual Therapy  Reported Symptoms: stress  Mental Status Exam: Appearance:  Casual     Behavior: Appropriate  Motor: Normal  Speech/Language:  Normal Rate  Affect: Appropriate  Mood: normal  Thought process: normal  Thought content:   WNL  Sensory/Perceptual disturbances:   WNL  Orientation: oriented to person, place, time/date, and situation  Attention: Good  Concentration: Good  Memory: WNL  Fund of knowledge:  Good  Insight:   Fair  Judgment:  Good  Impulse Control: Fair   Risk Assessment: Danger to Self:  No Self-injurious Behavior: No Danger to Others: No Duty to Warn:no Physical Aggression / Violence:No  Access to Firearms a concern: No  Gang Involvement:No   Subjective: Pt present for face-to-face individual therapy via video.  Pt consents to telehealth video session and is aware of limitations and benefits of virtual sessions. Location of pt: home Location of therapist: home office.     Pt will saw a psychiatrist a couple of weeks ago at [facility].  Pt was prescribed  Risperdal and Buspar.  She had side effects so the dosage was decreased.  Pt has not seen benefits from the medications yet. She will see her psychiatrist again December 3rd.  Pt talked about working on keeping herself calm.  Pt is trying to walk away when she feels angry and that time out has helped.    Pt has been busy which helps her mood. Pt is looking forward to the holidays.  The relationship with her daughter in law has been ok.  Helped pt process relationship dynamics.     Pt talked about her boyfriend.   Pt is working on her boyfriend's house to prepare for pt to move in with him.   They have talked about getting married.   Worked on Pharmacologist.  Provided supportive therapy.     Interventions: Cognitive Behavioral Therapy and Insight-Oriented  Diagnosis:  F33.1  Plan of Care: Recommend ongoing therapy.  Pt participated in setting treatment goals and is in agreement with treatment plan.   Pt wants to improve coping skills and reduce angry outbursts.  Plan to meet every two weeks.   Treatment Plan Client Abilities/Strengths  Pt is engaging, and motivated for therapy.   Client Treatment Preferences  Individual therapy.  Client Statement of Needs  Improve coping skills.  Symptoms  Depressed or irritable mood. Agitation  Problems Addressed  Unipolar Depression Goals 1. Alleviate depressive symptoms and return to previous level of effective functioning. 2. Appropriately grieve the loss in order to normalize mood and to return to previously adaptive level of functioning. Objective Learn and implement behavioral strategies to overcome depression. Target Date: 2023-11-15 Frequency: Biweekly  Progress: 10 Modality: individual  Related Interventions Engage the client in "behavioral activation," increasing his/her activity level and contact with sources of reward, while identifying processes that inhibit activation.  Use behavioral techniques such as instruction, rehearsal, role-playing, role reversal, as needed, to facilitate activity in the client's daily life; reinforce success. Assist the client in developing skills that increase the likelihood of deriving pleasure from behavioral activation (e.g., assertiveness skills, developing an exercise plan, less internal/more external focus, increased social involvement); reinforce success. Objective Identify important people in life, past and present, and describe the quality, good and poor, of those relationships. Target Date: 2023-11-15 Frequency: Biweekly  Progress: 10 Modality: individual  Related Interventions Conduct Interpersonal Therapy beginning with the assessment of the client's "interpersonal inventory" of  important past and present relationships; develop a case formulation linking depression to grief, interpersonal role disputes, role transitions, and/or interpersonal deficits). Objective Learn and implement problem-solving and decision-making skills. Target Date: 2023-11-15 Frequency: Biweekly  Progress: 10 Modality: individual  Related Interventions Conduct Problem-Solving Therapy using techniques such as psychoeducation, modeling, and role-playing to teach client problem-solving skills (i.e., defining a problem specifically, generating possible solutions, evaluating the pros and cons of each solution, selecting and implementing a plan of action, evaluating the efficacy of the plan, accepting or revising the plan); role-play application of the problem-solving skill to a real life issue. Encourage in the client the development of a positive problem orientation in which problems and solving them are viewed as a natural part of life and not something to be feared, despaired, or avoided. 3. Develop healthy interpersonal relationships that lead to the alleviation and help prevent the relapse of depression. 4. Develop healthy thinking patterns and beliefs about self, others, and the world that lead to the alleviation and help prevent the relapse of depression. 5. Recognize, accept, and cope with feelings of depression. Diagnosis F33.1  Conditions For Discharge Achievement of treatment goals and objectives   Salomon Fick, LCSW

## 2023-02-21 ENCOUNTER — Telehealth: Payer: Self-pay | Admitting: Adult Health

## 2023-02-21 NOTE — Telephone Encounter (Signed)
Next appt is 02/21/23.

## 2023-03-02 ENCOUNTER — Encounter: Payer: Self-pay | Admitting: Adult Health

## 2023-03-02 ENCOUNTER — Ambulatory Visit: Payer: BC Managed Care – PPO | Admitting: Adult Health

## 2023-03-02 DIAGNOSIS — F39 Unspecified mood [affective] disorder: Secondary | ICD-10-CM

## 2023-03-02 DIAGNOSIS — F411 Generalized anxiety disorder: Secondary | ICD-10-CM

## 2023-03-02 MED ORDER — LITHIUM CARBONATE 300 MG PO TABS: 300.0000 mg | ORAL_TABLET | Freq: Every day | ORAL | 1 refills | Status: DC

## 2023-03-02 MED ORDER — LITHIUM CARBONATE 300 MG PO TABS: 300.0000 mg | ORAL_TABLET | Freq: Every day | ORAL | 2 refills | Status: DC

## 2023-03-02 NOTE — Progress Notes (Addendum)
Derricka Froese [id number] 1971/01/27 52 y.o.  Subjective:   Patient ID:  Lori Oneal is a 52 y.o. (DOB 01/05/1971) female.  Chief Complaint: No chief complaint on file.   HPI Lori Oneal presents to the office today for follow-up of Mood disorder and GAD.  Previously seen by Dr. Rene Kocher and PCP for medication management.  Describes mood today as "about the same". Pleasant. Denies tearfulness. Mood symptoms - reports decreased depression. Reports anxiety most of the time. Denies panic attacks. Reports increased irritability '"blowing up at people" - "it comes out of no where". Reports worrying about her family. Reports rumination and over thinking. Reports a decreased interest and motivation. Reports mood swings. Reports sleeping better with Risperdal, but does not feel like it has helped with the irritability and mood fluctuations. She is willing to consider other options. Taking current medications as prescribed. Energy levels still higher. Active, does not have a regular exercise routine.  Enjoys some usual interests and activities. Lives alone. Has a boyfriend of 2 and 1/2 years. Has 3 children and 4 grandchildren. Spending time with family. Appetite improved. Weight gain 5 pounds - 110 pounds.  Sleeps well most nights. Averages 7 to 8 hours. Focus and concentration stable. Completing tasks. Managing aspects of household. 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  Denies AH or VH. Denies self harm.  Reports alcohol use every once in a while.  Previous medication trials:  Lamictal, Abilify, Seroquel, Wellbutrin, Zoloft    Review of Systems:  Review of Systems  Musculoskeletal:  Negative for gait problem.  Neurological:  Negative for tremors.  Psychiatric/Behavioral:         Please refer to HPI    Medications: I have reviewed the patient's current medications.  Current Outpatient Medications  Medication Sig Dispense Refill   lithium 300 MG tablet Take 1 tablet (300 mg  total) by mouth at bedtime. 30 tablet 2   lithium 300 MG tablet Take 1 tablet (300 mg total) by mouth at bedtime. 30 tablet 1   estrogen, conjugated,-medroxyprogesterone (PREMPRO) 0.625-2.5 MG tablet Take 1 tablet by mouth daily.     omeprazole (PRILOSEC) 40 MG capsule Take 40 mg by mouth daily.     propranolol ER (INDERAL LA) 60 MG 24 hr capsule Take 1 capsule (60 mg total) by mouth daily. 90 capsule 0   risperiDONE (RISPERDAL) 0.5 MG tablet Take 1 tablet (0.5 mg total) by mouth at bedtime. 30 tablet 2   No current facility-administered medications for this visit.    Medication Side Effects: None  Allergies: No Known Allergies  Past Medical History:  Diagnosis Date   Bipolar disorder [facility])     Past Medical History, Surgical history, Social history, and Family history were reviewed and updated as appropriate.   Please see review of systems for further details on the patient's review from today.   Objective:   Physical Exam:  There were no vitals taken for this visit.  Physical Exam Constitutional:      General: She is not in acute distress. Musculoskeletal:        General: No deformity.  Neurological:     Mental Status: She is alert and oriented to person, place, and time.     Coordination: Coordination normal.  Psychiatric:        Attention and Perception: Attention and perception normal. She does not perceive auditory or visual hallucinations.        Mood and Affect: Mood normal. Mood is not anxious or  depressed. Affect is not labile, blunt, angry or inappropriate.        Speech: Speech normal.        Behavior: Behavior normal.        Thought Content: Thought content normal. Thought content is not paranoid or delusional. Thought content does not include homicidal or suicidal ideation. Thought content does not include homicidal or suicidal plan.        Cognition and Memory: Cognition and memory normal.        Judgment: Judgment normal.     Comments: Insight intact      Lab Review:     Component Value Date/Time   NA 140 09/16/2016 0636   K 3.8 09/16/2016 0636   CL 104 09/16/2016 0636   CO2 29 09/16/2016 0636   GLUCOSE 111 (H) 09/16/2016 0636   BUN 13 09/16/2016 0636   CREATININE 0.63 09/16/2016 0636   CALCIUM 8.5 (L) 09/16/2016 0636   PROT 8.1 09/13/2016 1545   ALBUMIN 4.1 09/13/2016 1545   AST 22 09/13/2016 1545   ALT 15 09/13/2016 1545   ALKPHOS 52 09/13/2016 1545   BILITOT 1.0 09/13/2016 1545   GFRNONAA >60 09/16/2016 0636   GFRAA >60 09/16/2016 0636       Component Value Date/Time   WBC 6.0 09/16/2016 0636   RBC 3.83 (L) 09/16/2016 0636   HGB 11.0 (L) 09/16/2016 0636   HCT 33.3 (L) 09/16/2016 0636   PLT 267 09/16/2016 0636   MCV 86.9 09/16/2016 0636   MCH 28.7 09/16/2016 0636   MCHC 33.0 09/16/2016 0636   RDW 13.8 09/16/2016 0636   LYMPHSABS 0.8 09/16/2016 0636   MONOABS 0.9 09/16/2016 0636   EOSABS 0.1 09/16/2016 0636   BASOSABS 0.0 09/16/2016 0636    No results found for: "POCLITH", "LITHIUM"   No results found for: "PHENYTOIN", "PHENOBARB", "VALPROATE", "CBMZ"   .res Assessment: Plan:    Treatment Plan/Recommendations:   Plan:  PDMP reviewed  Risperdal 0.5mg  daily at bedtime - helping with sleep. Paxil 40mg  daily   Add Lithium 300mg  at hs for mood instability  D/C Trazadone 50mg  at hs D/C Buspar 20mg  daily  RTC 4 weeks  Patient advised to contact office with any questions, adverse effects, or acute worsening in signs and symptoms.   Discussed potential metabolic side effects associated with atypical antipsychotics, as well as potential risk for movement side effects. Advised pt to contact office if movement side effects occur.    Diagnoses and all orders for this visit:  Episodic mood disorder (HCC) -     lithium 300 MG tablet; Take 1 tablet (300 mg total) by mouth at bedtime. -     lithium 300 MG tablet; Take 1 tablet (300 mg total) by mouth at bedtime.  Generalized anxiety disorder      Please see After Visit Summary for patient specific instructions.  Future Appointments  Date Time Provider Department Center  03/30/2023 11:20 AM Makar Slatter, Thereasa Solo, NP [facility] None  04/27/2023  4:00 PM Bauert, Terri W, LCSW [facility] None    No orders of the defined types were placed in this encounter.   -------------------------------

## 2023-03-30 ENCOUNTER — Ambulatory Visit (INDEPENDENT_AMBULATORY_CARE_PROVIDER_SITE_OTHER): Payer: Self-pay | Admitting: Adult Health

## 2023-03-30 DIAGNOSIS — Z0389 Encounter for observation for other suspected diseases and conditions ruled out: Secondary | ICD-10-CM

## 2023-03-30 NOTE — Progress Notes (Signed)
 Patient no show appointment. ? ?

## 2023-04-27 ENCOUNTER — Ambulatory Visit: Payer: BC Managed Care – PPO | Admitting: Psychology

## 2023-04-27 NOTE — Progress Notes (Unsigned)
[facility] Counselor/Therapist Progress Note  Patient ID: Lori Oneal, MRN: [id number],    Date: 04/27/2023  Time Spent: 4:00pm-4:45pm   45 minutes   Treatment Type: Individual Therapy  Reported Symptoms: stress  Mental Status Exam: Appearance:  Casual     Behavior: Appropriate  Motor: Normal  Speech/Language:  Normal Rate  Affect: Appropriate  Mood: normal  Thought process: normal  Thought content:   WNL  Sensory/Perceptual disturbances:   WNL  Orientation: oriented to person, place, time/date, and situation  Attention: Good  Concentration: Good  Memory: WNL  Fund of knowledge:  Good  Insight:   Fair  Judgment:  Good  Impulse Control: Fair   Risk Assessment: Danger to Self:  No Self-injurious Behavior: No Danger to Others: No Duty to Warn:no Physical Aggression / Violence:No  Access to Firearms a concern: No  Gang Involvement:No   Subjective: Pt present for face-to-face individual therapy via video.  Pt consents to telehealth video session and is aware of limitations and benefits of virtual sessions. Location of pt: home Location of therapist: home office.     Pt   Pt talked about working on keeping herself calm.  Pt is trying to walk away when she feels angry and that time out has helped.    Pt has been busy which helps her mood.    Worked on Pharmacologist.  Provided supportive therapy.    Interventions: Cognitive Behavioral Therapy and Insight-Oriented  Diagnosis:  F33.1  Plan of Care: Recommend ongoing therapy.  Pt participated in setting treatment goals and is in agreement with treatment plan.   Pt wants to improve coping skills and reduce angry outbursts.  Plan to meet every two weeks.   Treatment Plan Client Abilities/Strengths  Pt is engaging, and motivated for therapy.   Client Treatment Preferences  Individual therapy.  Client Statement of Needs  Improve coping skills.  Symptoms  Depressed or irritable  mood. Agitation  Problems Addressed  Unipolar Depression Goals 1. Alleviate depressive symptoms and return to previous level of effective functioning. 2. Appropriately grieve the loss in order to normalize mood and to return to previously adaptive level of functioning. Objective Learn and implement behavioral strategies to overcome depression. Target Date: 2023-11-15 Frequency: Biweekly  Progress: 10 Modality: individual  Related Interventions Engage the client in "behavioral activation," increasing his/her activity level and contact with sources of reward, while identifying processes that inhibit activation.  Use behavioral techniques such as instruction, rehearsal, role-playing, role reversal, as needed, to facilitate activity in the client's daily life; reinforce success. Assist the client in developing skills that increase the likelihood of deriving pleasure from behavioral activation (e.g., assertiveness skills, developing an exercise plan, less internal/more external focus, increased social involvement); reinforce success. Objective Identify important people in life, past and present, and describe the quality, good and poor, of those relationships. Target Date: 2023-11-15 Frequency: Biweekly  Progress: 10 Modality: individual  Related Interventions Conduct Interpersonal Therapy beginning with the assessment of the client's "interpersonal inventory" of important past and present relationships; develop a case formulation linking depression to grief, interpersonal role disputes, role transitions, and/or interpersonal deficits). Objective Learn and implement problem-solving and decision-making skills. Target Date: 2023-11-15 Frequency: Biweekly  Progress: 10 Modality: individual  Related Interventions Conduct Problem-Solving Therapy using techniques such as psychoeducation, modeling, and role-playing to teach client problem-solving skills (i.e., defining a problem specifically, generating  possible solutions, evaluating the pros and cons of each solution, selecting and implementing a plan of action, evaluating the efficacy  of the plan, accepting or revising the plan); role-play application of the problem-solving skill to a real life issue. Encourage in the client the development of a positive problem orientation in which problems and solving them are viewed as a natural part of life and not something to be feared, despaired, or avoided. 3. Develop healthy interpersonal relationships that lead to the alleviation and help prevent the relapse of depression. 4. Develop healthy thinking patterns and beliefs about self, others, and the world that lead to the alleviation and help prevent the relapse of depression. 5. Recognize, accept, and cope with feelings of depression. Diagnosis F33.1  Conditions For Discharge Achievement of treatment goals and objectives   Salomon Fick, LCSW

## 2023-04-30 ENCOUNTER — Other Ambulatory Visit: Payer: Self-pay | Admitting: Adult Health

## 2023-04-30 DIAGNOSIS — F39 Unspecified mood [affective] disorder: Secondary | ICD-10-CM

## 2023-04-30 NOTE — Telephone Encounter (Signed)
Please schedule pt an appt lv 12/6 due back in 4 weeks.

## 2023-05-02 NOTE — Telephone Encounter (Signed)
 Pt is scheduled for 05/25/23.

## 2023-05-25 ENCOUNTER — Encounter: Payer: Self-pay | Admitting: Adult Health

## 2023-05-25 ENCOUNTER — Telehealth: Payer: BC Managed Care – PPO | Admitting: Adult Health

## 2023-05-25 DIAGNOSIS — F411 Generalized anxiety disorder: Secondary | ICD-10-CM | POA: Diagnosis not present

## 2023-05-25 DIAGNOSIS — Z79899 Other long term (current) drug therapy: Secondary | ICD-10-CM

## 2023-05-25 DIAGNOSIS — F39 Unspecified mood [affective] disorder: Secondary | ICD-10-CM

## 2023-05-25 MED ORDER — PAROXETINE HCL 40 MG PO TABS: 40.0000 mg | ORAL_TABLET | Freq: Every morning | ORAL | 2 refills | Status: AC

## 2023-05-25 MED ORDER — LITHIUM CARBONATE 300 MG PO TABS: 300.0000 mg | ORAL_TABLET | Freq: Every day | ORAL | 2 refills | Status: AC

## 2023-05-25 MED ORDER — RISPERIDONE 1 MG PO TABS: 1.0000 mg | ORAL_TABLET | Freq: Every day | ORAL | 2 refills | Status: DC

## 2023-05-25 NOTE — Progress Notes (Signed)
 Lori Oneal [id number] [birth year] 53 y.o.  Virtual Visit via Video Note  I connected with pt @ on 05/25/23 at 10:30 AM EST by a video enabled telemedicine application and verified that I am speaking with the correct person using two identifiers.   I discussed the limitations of evaluation and management by telemedicine and the availability of in person appointments. The patient expressed understanding and agreed to proceed.  I discussed the assessment and treatment plan with the patient. The patient was provided an opportunity to ask questions and all were answered. The patient agreed with the plan and demonstrated an understanding of the instructions.   The patient was advised to call back or seek an in-person evaluation if the symptoms worsen or if the condition fails to improve as anticipated.  I provided 20 minutes of non-face-to-face time during this encounter.  The patient was located at home.  The provider was located at [facility].   Dorothyann Gibbs, NP   Subjective:   Patient ID:  Lori Oneal is a 53 y.o. (DOB 01/11/1971) female.  Chief Complaint: No chief complaint on file.   HPI Lori Oneal presents for follow-up of Mood disorder and GAD.  Previously seen by Dr. Rene Kocher and PCP for medication management.  Describes mood today as "better overall". Pleasant. Denies tearfulness. Mood symptoms - denies depression. Reports improved interest and motivation. Reports anxiety "all the time".  Denies panic attacks. Reports irritability "trying to control it better". Reports she has "blown up" a few times with co-worker and boyfriend. Reports decreased worry about her family. Reports decreased rumination and over thinking. Reports decreased mood swings with the addition on Lithium and Riserdal. Taking current medications as prescribed. Energy levels "good". Active, does not have a regular exercise routine.  Enjoys some usual interests and activities. Lives with  boyfriend. Has 3 children and 4 grandchildren. Spending time with family. Appetite improved. Weight loss - 117 from 126 pounds.  Sleeps well most nights. Averages 6 to 8 hours. Focus and concentration stable. Completing tasks. Managing aspects of household. 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  Denies AH or VH. Denies self harm.  Reports alcohol use every once in a while.  Previous medication trials:  Lamictal, Abilify, Seroquel, Wellbutrin, Zoloft  Review of Systems:  Review of Systems  Musculoskeletal:  Negative for gait problem.  Neurological:  Negative for tremors.  Psychiatric/Behavioral:         Please refer to HPI    Medications: I have reviewed the patient's current medications.  Current Outpatient Medications  Medication Sig Dispense Refill   estrogen, conjugated,-medroxyprogesterone (PREMPRO) 0.625-2.5 MG tablet Take 1 tablet by mouth daily.     lithium 300 MG tablet Take 1 tablet (300 mg total) by mouth at bedtime. 30 tablet 2   lithium 300 MG tablet Take 1 tablet (300 mg total) by mouth at bedtime. 30 tablet 1   omeprazole (PRILOSEC) 40 MG capsule Take 40 mg by mouth daily.     propranolol ER (INDERAL LA) 60 MG 24 hr capsule Take 1 capsule (60 mg total) by mouth daily. 90 capsule 0   risperiDONE (RISPERDAL) 0.5 MG tablet TAKE 1 TABLET BY MOUTH AT BEDTIME 23 tablet 0   No current facility-administered medications for this visit.    Medication Side Effects: None  Allergies: No Known Allergies  Past Medical History:  Diagnosis Date   Bipolar disorder (HCC)     Family History  Problem Relation Age of Onset   COPD  Mother    Diabetes Other     Social History   Socioeconomic History   Marital status: Married    Spouse name: [name]   Number of children: 3   Years of education: Not on file   Highest education level: 10th grade  Occupational History   Not on file  Tobacco Use   Smoking status: Former   Smokeless tobacco: Never  Vaping Use    Vaping status: Never Used  Substance and Sexual Activity   Alcohol use: No   Drug use: No   Sexual activity: Yes    Birth control/protection: Surgical  Other Topics Concern   Not on file  Social History Narrative   Not on file   Social Drivers of Health   Financial Resource Strain: Low Risk  (01/29/2017)   Overall Financial Resource Strain (CARDIA)    Difficulty of Paying Living Expenses: Not very hard  Food Insecurity: Low Risk  (02/02/2023)   Received from [facility]   Hunger Vital Sign    Worried About Running Out of Food in the Last Year: Never true    Ran Out of Food in the Last Year: Never true  Transportation Needs: No Transportation Needs (02/02/2023)   Received from [facility]    In the past 12 months, has lack of reliable transportation kept you from medical appointments, meetings, work or from getting things needed for daily living? : No  Physical Activity: Inactive (01/29/2017)   Exercise Vital Sign    Days of Exercise per Week: 0 days    Minutes of Exercise per Session: 0 min  Stress: Stress Concern Present (01/29/2017)   [employer] of Occupational Health - Occupational Stress Questionnaire    Feeling of Stress : Very much  Social Connections: Moderately Isolated (01/29/2017)   Social Connection and Isolation Panel [NHANES]    Frequency of Communication with Friends and Family: Once a week    Frequency of Social Gatherings with Friends and Family: Once a week    Attends Religious Services: Never    Database administrator or Organizations: No    Attends Banker Meetings: Never    Marital Status: Married  Catering manager Violence: Not At Risk (01/29/2017)   Humiliation, Afraid, Rape, and Kick questionnaire    Fear of Current or Ex-Partner: No    Emotionally Abused: No    Physically Abused: No    Sexually Abused: No    Past Medical History, Surgical history, Social history, and Family history were reviewed and updated as  appropriate.   Please see review of systems for further details on the patient's review from today.   Objective:   Physical Exam:  There were no vitals taken for this visit.  Physical Exam Constitutional:      General: She is not in acute distress. Musculoskeletal:        General: No deformity.  Neurological:     Mental Status: She is alert and oriented to person, place, and time.     Coordination: Coordination normal.  Psychiatric:        Attention and Perception: Attention and perception normal. She does not perceive auditory or visual hallucinations.        Mood and Affect: Affect is not labile, blunt, angry or inappropriate.        Speech: Speech normal.        Behavior: Behavior normal.        Thought Content: Thought content normal. Thought content  is not paranoid or delusional. Thought content does not include homicidal or suicidal ideation. Thought content does not include homicidal or suicidal plan.        Cognition and Memory: Cognition and memory normal.        Judgment: Judgment normal.     Comments: Insight intact     Lab Review:     Component Value Date/Time   NA 140 09/16/2016 0636   K 3.8 09/16/2016 0636   CL 104 09/16/2016 0636   CO2 29 09/16/2016 0636   GLUCOSE 111 (H) 09/16/2016 0636   BUN 13 09/16/2016 0636   CREATININE 0.63 09/16/2016 0636   CALCIUM 8.5 (L) 09/16/2016 0636   PROT 8.1 09/13/2016 1545   ALBUMIN 4.1 09/13/2016 1545   AST 22 09/13/2016 1545   ALT 15 09/13/2016 1545   ALKPHOS 52 09/13/2016 1545   BILITOT 1.0 09/13/2016 1545   GFRNONAA >60 09/16/2016 0636   GFRAA >60 09/16/2016 0636       Component Value Date/Time   WBC 6.0 09/16/2016 0636   RBC 3.83 (L) 09/16/2016 0636   HGB 11.0 (L) 09/16/2016 0636   HCT 33.3 (L) 09/16/2016 0636   PLT 267 09/16/2016 0636   MCV 86.9 09/16/2016 0636   MCH 28.7 09/16/2016 0636   MCHC 33.0 09/16/2016 0636   RDW 13.8 09/16/2016 0636   LYMPHSABS 0.8 09/16/2016 0636   MONOABS 0.9 09/16/2016 0636    EOSABS 0.1 09/16/2016 0636   BASOSABS 0.0 09/16/2016 0636    No results found for: "POCLITH", "LITHIUM"   No results found for: "PHENYTOIN", "PHENOBARB", "VALPROATE", "CBMZ"   .res Assessment: Plan:    Treatment Plan/Recommendations:   Plan:  PDMP reviewed  Will increase Risperdal 0.5mg  to 1mg  daily at bedtime - to help manage mood symptoms.  Paxil 40mg  daily   Lithium 300mg  at hs for mood instability - labs: BMP, Lithium level, TSH  RTC 4 weeks  20 minutes spent dedicated to the care of this patient on the date of this encounter to include pre-visit review of records, ordering of medication, post visit documentation, and face-to-face time with the patient discussing mood disorder and GAD. Discussed continuing current medication regimen.  Patient advised to contact office with any questions, adverse effects, or acute worsening in signs and symptoms.   Discussed potential metabolic side effects associated with atypical antipsychotics, as well as potential risk for movement side effects. Advised pt to contact office if movement side effects occur.    There are no diagnoses linked to this encounter.   Please see After Visit Summary for patient specific instructions.  Future Appointments  Date Time Provider Department Center  05/25/2023 10:30 AM Maylin Freeburg, Thereasa Solo, NP [facility] None  05/31/2023  5:00 PM Bauert, Rella Larve, LCSW [facility] None  06/29/2023  3:00 PM Bauert, Terri W, LCSW [facility] None    No orders of the defined types were placed in this encounter.     -------------------------------

## 2023-05-31 ENCOUNTER — Ambulatory Visit: Payer: BC Managed Care – PPO | Admitting: Psychology

## 2023-06-04 ENCOUNTER — Other Ambulatory Visit: Payer: Self-pay | Admitting: Adult Health

## 2023-06-04 DIAGNOSIS — Z79899 Other long term (current) drug therapy: Secondary | ICD-10-CM

## 2023-06-22 ENCOUNTER — Telehealth (INDEPENDENT_AMBULATORY_CARE_PROVIDER_SITE_OTHER): Payer: BC Managed Care – PPO | Admitting: Adult Health

## 2023-06-22 ENCOUNTER — Encounter: Payer: Self-pay | Admitting: Adult Health

## 2023-06-22 DIAGNOSIS — Z0389 Encounter for observation for other suspected diseases and conditions ruled out: Secondary | ICD-10-CM

## 2023-06-22 NOTE — Progress Notes (Signed)
 Patient no show appointment. ? ?

## 2023-06-22 NOTE — Progress Notes (Deleted)
 Lori Oneal [id number] 1970/10/12 53 y.o.  Virtual Visit via Video Note  I connected with pt @ on 06/22/23 at  1:00 PM EDT by a video enabled telemedicine application and verified that I am speaking with the correct person using two identifiers.   I discussed the limitations of evaluation and management by telemedicine and the availability of in person appointments. The patient expressed understanding and agreed to proceed.  I discussed the assessment and treatment plan with the patient. The patient was provided an opportunity to ask questions and all were answered. The patient agreed with the plan and demonstrated an understanding of the instructions.   The patient was advised to call back or seek an in-person evaluation if the symptoms worsen or if the condition fails to improve as anticipated.  I provided 25 minutes of non-face-to-face time during this encounter.  The patient was located at home.  The provider was located at [facility].   Dorothyann Gibbs, NP   Subjective:   Patient ID:  Lori Oneal is a 53 y.o. (DOB 01-23-1971) female.  Chief Complaint: No chief complaint on file.   HPI Lori Oneal presents for follow-up of Mood disorder and GAD.  Previously seen by Dr. Rene Kocher and PCP for medication management.  Describes mood today as "better overall". Pleasant. Denies tearfulness. Mood symptoms - denies depression. Reports improved interest and motivation. Reports anxiety "all the time".  Denies panic attacks. Reports irritability "trying to control it better". Reports she has "blown up" a few times with co-worker and boyfriend. Reports decreased worry about her family. Reports decreased rumination and over thinking. Reports decreased mood swings with the addition on Lithium and Riserdal. Taking current medications as prescribed. Energy levels "good". Active, does not have a regular exercise routine.  Enjoys some usual interests and activities. Lives with  boyfriend. Has 3 children and 4 grandchildren. Spending time with family. Appetite improved. Weight loss - 117 from 126 pounds.  Sleeps well most nights. Averages 6 to 8 hours. Focus and concentration stable. Completing tasks. Managing aspects of household. 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  Denies AH or VH. Denies self harm.  Reports alcohol use every once in a while.  Previous medication trials:  Lamictal, Abilify, Seroquel, Wellbutrin, Zoloft   Review of Systems:  Review of Systems  Musculoskeletal:  Negative for gait problem.  Neurological:  Negative for tremors.  Psychiatric/Behavioral:         Please refer to HPI    Medications: I have reviewed the patient's current medications.  Current Outpatient Medications  Medication Sig Dispense Refill   estrogen, conjugated,-medroxyprogesterone (PREMPRO) 0.625-2.5 MG tablet Take 1 tablet by mouth daily.     lithium 300 MG tablet Take 1 tablet (300 mg total) by mouth at bedtime. 30 tablet 2   omeprazole (PRILOSEC) 40 MG capsule Take 40 mg by mouth daily.     PARoxetine (PAXIL) 40 MG tablet Take 1 tablet (40 mg total) by mouth every morning. 30 tablet 2   propranolol ER (INDERAL LA) 60 MG 24 hr capsule Take 1 capsule (60 mg total) by mouth daily. 90 capsule 0   risperiDONE (RISPERDAL) 1 MG tablet Take 1 tablet (1 mg total) by mouth at bedtime. 30 tablet 2   No current facility-administered medications for this visit.    Medication Side Effects: None  Allergies: No Known Allergies  Past Medical History:  Diagnosis Date   Bipolar disorder (HCC)     Family History  Problem Relation  Age of Onset   COPD Mother    Diabetes Other     Social History   Socioeconomic History   Marital status: Married    Spouse name: [name]   Number of children: 3   Years of education: Not on file   Highest education level: 10th grade  Occupational History   Not on file  Tobacco Use   Smoking status: Former   Smokeless tobacco:  Never  Vaping Use   Vaping status: Never Used  Substance and Sexual Activity   Alcohol use: No   Drug use: No   Sexual activity: Yes    Birth control/protection: Surgical  Other Topics Concern   Not on file  Social History Narrative   Not on file   Social Drivers of Health   Financial Resource Strain: Low Risk  (01/29/2017)   Overall Financial Resource Strain (CARDIA)    Difficulty of Paying Living Expenses: Not very hard  Food Insecurity: Low Risk  (02/02/2023)   Received from [facility]   Hunger Vital Sign    Worried About Running Out of Food in the Last Year: Never true    Ran Out of Food in the Last Year: Never true  Transportation Needs: No Transportation Needs (02/02/2023)   Received from [facility]    In the past 12 months, has lack of reliable transportation kept you from medical appointments, meetings, work or from getting things needed for daily living? : No  Physical Activity: Inactive (01/29/2017)   Exercise Vital Sign    Days of Exercise per Week: 0 days    Minutes of Exercise per Session: 0 min  Stress: Stress Concern Present (01/29/2017)   [employer] of Occupational Health - Occupational Stress Questionnaire    Feeling of Stress : Very much  Social Connections: Moderately Isolated (01/29/2017)   Social Connection and Isolation Panel [NHANES]    Frequency of Communication with Friends and Family: Once a week    Frequency of Social Gatherings with Friends and Family: Once a week    Attends Religious Services: Never    Database administrator or Organizations: No    Attends Banker Meetings: Never    Marital Status: Married  Catering manager Violence: Not At Risk (01/29/2017)   Humiliation, Afraid, Rape, and Kick questionnaire    Fear of Current or Ex-Partner: No    Emotionally Abused: No    Physically Abused: No    Sexually Abused: No    Past Medical History, Surgical history, Social history, and Family history were  reviewed and updated as appropriate.   Please see review of systems for further details on the patient's review from today.   Objective:   Physical Exam:  There were no vitals taken for this visit.  Physical Exam Constitutional:      General: She is not in acute distress. Musculoskeletal:        General: No deformity.  Neurological:     Mental Status: She is alert and oriented to person, place, and time.     Coordination: Coordination normal.  Psychiatric:        Attention and Perception: Attention and perception normal. She does not perceive auditory or visual hallucinations.        Mood and Affect: Affect is not labile, blunt, angry or inappropriate.        Speech: Speech normal.        Behavior: Behavior normal.        Thought  Content: Thought content normal. Thought content is not paranoid or delusional. Thought content does not include homicidal or suicidal ideation. Thought content does not include homicidal or suicidal plan.        Cognition and Memory: Cognition and memory normal.        Judgment: Judgment normal.     Comments: Insight intact     Lab Review:     Component Value Date/Time   NA 140 09/16/2016 0636   K 3.8 09/16/2016 0636   CL 104 09/16/2016 0636   CO2 29 09/16/2016 0636   GLUCOSE 111 (H) 09/16/2016 0636   BUN 13 09/16/2016 0636   CREATININE 0.63 09/16/2016 0636   CALCIUM 8.5 (L) 09/16/2016 0636   PROT 8.1 09/13/2016 1545   ALBUMIN 4.1 09/13/2016 1545   AST 22 09/13/2016 1545   ALT 15 09/13/2016 1545   ALKPHOS 52 09/13/2016 1545   BILITOT 1.0 09/13/2016 1545   GFRNONAA >60 09/16/2016 0636   GFRAA >60 09/16/2016 0636       Component Value Date/Time   WBC 6.0 09/16/2016 0636   RBC 3.83 (L) 09/16/2016 0636   HGB 11.0 (L) 09/16/2016 0636   HCT 33.3 (L) 09/16/2016 0636   PLT 267 09/16/2016 0636   MCV 86.9 09/16/2016 0636   MCH 28.7 09/16/2016 0636   MCHC 33.0 09/16/2016 0636   RDW 13.8 09/16/2016 0636   LYMPHSABS 0.8 09/16/2016 0636    MONOABS 0.9 09/16/2016 0636   EOSABS 0.1 09/16/2016 0636   BASOSABS 0.0 09/16/2016 0636    No results found for: "POCLITH", "LITHIUM"   No results found for: "PHENYTOIN", "PHENOBARB", "VALPROATE", "CBMZ"   .res Assessment: Plan:    There are no diagnoses linked to this encounter.   Plan:  PDMP reviewed  Will increase Risperdal 0.5mg  to 1mg  daily at bedtime - to help manage mood symptoms.  Paxil 40mg  daily   Lithium 300mg  at hs for mood instability - labs: BMP, Lithium level, TSH  RTC 4 weeks  20 minutes spent dedicated to the care of this patient on the date of this encounter to include pre-visit review of records, ordering of medication, post visit documentation, and face-to-face time with the patient discussing mood disorder and GAD. Discussed continuing current medication regimen.  Patient advised to contact office with any questions, adverse effects, or acute worsening in signs and symptoms.   Discussed potential metabolic side effects associated with atypical antipsychotics, as well as potential risk for movement side effects. Advised pt to contact office if movement side effects occur.  ease see After Visit Summary for patient specific instructions.  Future Appointments  Date Time Provider Department Center  06/22/2023  1:00 PM Navina Wohlers, Thereasa Solo, NP [facility] None  06/29/2023  3:00 PM Bauert, Terri W, LCSW [facility] None    No orders of the defined types were placed in this encounter.     -------------------------------

## 2023-06-29 ENCOUNTER — Ambulatory Visit: Payer: BC Managed Care – PPO | Admitting: Psychology

## 2023-06-29 DIAGNOSIS — F331 Major depressive disorder, recurrent, moderate: Secondary | ICD-10-CM

## 2023-06-29 NOTE — Progress Notes (Signed)
 [facility] Counselor/Therapist Progress Note  Patient ID: Lori Oneal, MRN: [id number],    Date: 06/29/2023  Time Spent: 3:00pm-3:45pm   45 minutes   Treatment Type: Individual Therapy  Reported Symptoms: stress  Mental Status Exam: Appearance:  Casual     Behavior: Appropriate  Motor: Normal  Speech/Language:  Normal Rate  Affect: Appropriate  Mood: normal  Thought process: normal  Thought content:   WNL  Sensory/Perceptual disturbances:   WNL  Orientation: oriented to person, place, time/date, and situation  Attention: Good  Concentration: Good  Memory: WNL  Fund of knowledge:  Good  Insight:   Fair  Judgment:  Good  Impulse Control: Fair   Risk Assessment: Danger to Self:  No Self-injurious Behavior: No Danger to Others: No Duty to Warn:no Physical Aggression / Violence:No  Access to Firearms a concern: No  Gang Involvement:No   Subjective: Pt present for face-to-face individual therapy via video.  Pt consents to telehealth video session and is aware of limitations and benefits of virtual sessions. Location of pt: home Location of therapist: home office.     Pt states she has been busy and is still working 2 jobs.   She has moved in with her boyfriend and living together is going well.   Pt states she still has anger episodes but they are less frequent.   Addressed the anger issues that occur with pt's boyfriend.  Pt verbally lashes out and then feels remorseful and apologizes.   She is worried that these outbursts could jeapordize her relationship.   Worked on anger management strategies.   Pt is trying to use time outs.   Encouraged her to decompress throughout the day by taking walks as well.  Pt is seeing her psychiatrist monthly and is currently prescribed lithium and risperidol.   Pt does not feel the medications help as much as she would like.  Pt will see her psychiatrist again in a month.  Encouraged her to address possible need for medication  adjustment.   Worked on Pharmacologist.  Provided supportive therapy.    Interventions: Cognitive Behavioral Therapy and Insight-Oriented  Diagnosis:  F33.1  Plan of Care: Recommend ongoing therapy.  Pt participated in setting treatment goals and is in agreement with treatment plan.   Pt wants to improve coping skills and reduce angry outbursts.  Plan to meet every two weeks.   Treatment Plan Client Abilities/Strengths  Pt is engaging, and motivated for therapy.   Client Treatment Preferences  Individual therapy.  Client Statement of Needs  Improve coping skills.  Symptoms  Depressed or irritable mood. Agitation  Problems Addressed  Unipolar Depression Goals 1. Alleviate depressive symptoms and return to previous level of effective functioning. 2. Appropriately grieve the loss in order to normalize mood and to return to previously adaptive level of functioning. Objective Learn and implement behavioral strategies to overcome depression. Target Date: 2023-11-15 Frequency: Biweekly  Progress: 10 Modality: individual  Related Interventions Engage the client in "behavioral activation," increasing his/her activity level and contact with sources of reward, while identifying processes that inhibit activation.  Use behavioral techniques such as instruction, rehearsal, role-playing, role reversal, as needed, to facilitate activity in the client's daily life; reinforce success. Assist the client in developing skills that increase the likelihood of deriving pleasure from behavioral activation (e.g., assertiveness skills, developing an exercise plan, less internal/more external focus, increased social involvement); reinforce success. Objective Identify important people in life, past and present, and describe the quality, good and poor,  of those relationships. Target Date: 2023-11-15 Frequency: Biweekly  Progress: 10 Modality: individual  Related Interventions Conduct Interpersonal Therapy  beginning with the assessment of the client's "interpersonal inventory" of important past and present relationships; develop a case formulation linking depression to grief, interpersonal role disputes, role transitions, and/or interpersonal deficits). Objective Learn and implement problem-solving and decision-making skills. Target Date: 2023-11-15 Frequency: Biweekly  Progress: 10 Modality: individual  Related Interventions Conduct Problem-Solving Therapy using techniques such as psychoeducation, modeling, and role-playing to teach client problem-solving skills (i.e., defining a problem specifically, generating possible solutions, evaluating the pros and cons of each solution, selecting and implementing a plan of action, evaluating the efficacy of the plan, accepting or revising the plan); role-play application of the problem-solving skill to a real life issue. Encourage in the client the development of a positive problem orientation in which problems and solving them are viewed as a natural part of life and not something to be feared, despaired, or avoided. 3. Develop healthy interpersonal relationships that lead to the alleviation and help prevent the relapse of depression. 4. Develop healthy thinking patterns and beliefs about self, others, and the world that lead to the alleviation and help prevent the relapse of depression. 5. Recognize, accept, and cope with feelings of depression. Diagnosis F33.1  Conditions For Discharge Achievement of treatment goals and objectives   Salomon Fick, LCSW

## 2023-08-14 ENCOUNTER — Ambulatory Visit (INDEPENDENT_AMBULATORY_CARE_PROVIDER_SITE_OTHER): Admitting: Psychology

## 2023-08-14 DIAGNOSIS — F331 Major depressive disorder, recurrent, moderate: Secondary | ICD-10-CM | POA: Diagnosis not present

## 2023-08-14 NOTE — Progress Notes (Signed)
 [facility] Counselor/Therapist Progress Note  Patient ID: Lori Oneal, MRN: [id number],    Date: 08/14/2023  Time Spent: 4:00pm-4:45pm   45 minutes   Treatment Type: Individual Therapy  Reported Symptoms: stress  Mental Status Exam: Appearance:  Casual     Behavior: Appropriate  Motor: Normal  Speech/Language:  Normal Rate  Affect: Appropriate  Mood: normal  Thought process: normal  Thought content:   WNL  Sensory/Perceptual disturbances:   WNL  Orientation: oriented to person, place, time/date, and situation  Attention: Good  Concentration: Good  Memory: WNL  Fund of knowledge:  Good  Insight:   Fair  Judgment:  Good  Impulse Control: Fair   Risk Assessment: Danger to Self:  No Self-injurious Behavior: No Danger to Others: No Duty to Warn:no Physical Aggression / Violence:No  Access to Firearms a concern: No  Gang Involvement:No   Subjective: Pt present for face-to-face individual therapy via video.  Pt consents to telehealth video session and is aware of limitations and benefits of virtual sessions. Location of pt: home Location of therapist: home office.    Pt states that overall she is doing well.  Her psychiatrist increased her medication doses of risperodol and lithium  and pt feels like the medications are helping.  Pt has also been putting in the effort to utilize anger management strategies we have talked about.  She is giving herself time outs and is using self talk to let go of irritations that are not worth arguing about.   Pt states she feels proud of herself and her progress.  Pt states it is going well living with her boyfriend and they have set a date to get married.  [place]24.   Pt is excited and enjoying wedding planning.   Pt talked about a couple of things she has worried about.   Her son Randel Buss was in a dirt bike accident and broke his back.  He is out of the hospital now and will be ok after  some rehab. Pt's ex husband is on drugs and not doing well.  Pt is upset about how this affects their kids and grandkids.   Helped pt process her feelings.   Worked on Pharmacologist.  Provided supportive therapy.    Interventions: Cognitive Behavioral Therapy and Insight-Oriented  Diagnosis:  F33.1  Plan of Care: Recommend ongoing therapy.  Pt participated in setting treatment goals and is in agreement with treatment plan.   Pt wants to improve coping skills and reduce angry outbursts.  Plan to meet every two weeks.   Treatment Plan Client Abilities/Strengths  Pt is engaging, and motivated for therapy.   Client Treatment Preferences  Individual therapy.  Client Statement of Needs  Improve coping skills.  Symptoms  Depressed or irritable mood. Agitation  Problems Addressed  Unipolar Depression Goals 1. Alleviate depressive symptoms and return to previous level of effective functioning. 2. Appropriately grieve the loss in order to normalize mood and to return to previously adaptive level of functioning. Objective Learn and implement behavioral strategies to overcome depression. Target Date: 2023-11-15 Frequency: Biweekly  Progress: 10 Modality: individual  Related Interventions Engage the client in "behavioral activation," increasing his/her activity level and contact with sources of reward, while identifying processes that inhibit activation.  Use behavioral techniques such as instruction, rehearsal, role-playing, role reversal, as needed, to facilitate activity in the client's daily life; reinforce success. Assist the client in developing skills that increase the likelihood of  deriving pleasure from behavioral activation (e.g., assertiveness skills, developing an exercise plan, less internal/more external focus, increased social involvement); reinforce success. Objective Identify important people in life, past and present, and describe the quality, good and poor, of those  relationships. Target Date: 2023-11-15 Frequency: Biweekly  Progress: 10 Modality: individual  Related Interventions Conduct Interpersonal Therapy beginning with the assessment of the client's "interpersonal inventory" of important past and present relationships; develop a case formulation linking depression to grief, interpersonal role disputes, role transitions, and/or interpersonal deficits). Objective Learn and implement problem-solving and decision-making skills. Target Date: 2023-11-15 Frequency: Biweekly  Progress: 10 Modality: individual  Related Interventions Conduct Problem-Solving Therapy using techniques such as psychoeducation, modeling, and role-playing to teach client problem-solving skills (i.e., defining a problem specifically, generating possible solutions, evaluating the pros and cons of each solution, selecting and implementing a plan of action, evaluating the efficacy of the plan, accepting or revising the plan); role-play application of the problem-solving skill to a real life issue. Encourage in the client the development of a positive problem orientation in which problems and solving them are viewed as a natural part of life and not something to be feared, despaired, or avoided. 3. Develop healthy interpersonal relationships that lead to the alleviation and help prevent the relapse of depression. 4. Develop healthy thinking patterns and beliefs about self, others, and the world that lead to the alleviation and help prevent the relapse of depression. 5. Recognize, accept, and cope with feelings of depression. Diagnosis F33.1  Conditions For Discharge Achievement of treatment goals and objectives   Willey Harrier, LCSW

## 2023-09-11 ENCOUNTER — Other Ambulatory Visit: Payer: Self-pay | Admitting: Adult Health

## 2023-09-11 DIAGNOSIS — F39 Unspecified mood [affective] disorder: Secondary | ICD-10-CM

## 2023-10-14 ENCOUNTER — Other Ambulatory Visit: Payer: Self-pay | Admitting: Adult Health

## 2023-10-14 DIAGNOSIS — F39 Unspecified mood [affective] disorder: Secondary | ICD-10-CM

## 2023-10-25 ENCOUNTER — Ambulatory Visit: Admitting: Psychology

## 2023-11-16 ENCOUNTER — Ambulatory Visit (INDEPENDENT_AMBULATORY_CARE_PROVIDER_SITE_OTHER): Admitting: Psychology

## 2023-11-16 DIAGNOSIS — F331 Major depressive disorder, recurrent, moderate: Secondary | ICD-10-CM | POA: Diagnosis not present
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
## 2024-02-14 ENCOUNTER — Ambulatory Visit (INDEPENDENT_AMBULATORY_CARE_PROVIDER_SITE_OTHER): Admitting: Psychology

## 2024-02-14 DIAGNOSIS — F331 Major depressive disorder, recurrent, moderate: Secondary | ICD-10-CM | POA: Diagnosis not present

## 2024-02-14 NOTE — Progress Notes (Deleted)
   Severo Beber, LCSW

## 2024-02-14 NOTE — Progress Notes (Signed)
 [facility] Counselor/Therapist Progress Note  Patient ID: Lori Oneal, MRN: [id number],    Date: 02/14/2024  Time Spent: 4:00pm-4:45pm   45 minutes   Treatment Type: Individual Therapy  Reported Symptoms: stress, confusion  Mental Status Exam: Appearance:  Casual     Behavior: Appropriate  Motor: Normal  Speech/Language:  Normal Rate  Affect: Appropriate  Mood: normal  Thought process: normal  Thought content:   WNL  Sensory/Perceptual disturbances:   WNL  Orientation: oriented to person, place, time/date, and situation  Attention: Good  Concentration: Good  Memory: WNL  Fund of knowledge:  Good  Insight:   Good  Judgment:  Good  Impulse Control: Good   Risk Assessment: Danger to Self:  No Self-injurious Behavior: No Danger to Others: No Duty to Warn:no Physical Aggression / Violence:No  Access to Firearms a concern: No  Gang Involvement:No   Subjective: Pt present for face-to-face individual therapy via video.  Pt consents to telehealth video session and is aware of limitations and benefits of virtual sessions. Location of pt: home Location of therapist: home office.   Pt states she feels like the past couple of months have been a whirlwind.   Pt has been confused about her current relationship since the death of her ex-husband 3 months ago.  Pt states she does not want to get married.   She called off the wedding to her boyfriend.  They are still living together and pt states they get along well but she feels like he is too soft for her bc he talks about his feelings.   Pt has not been coping with her feelings.   She has been smoking pt every day and evening.   She quit taking her psych meds and states that pot helps her feel mellow.   Pt knows that this is not good for her and she is going to try to stop smoking.  Addressed how it is important for pt to not make any big decisions while she is so confused.  She states she will wait until she gets  clarity and gets more grounded.    Recommended pt journal her feelings. Pt is still working two jobs and also stays busy with her kids and grandkids.    Worked on self care strategies. Provided supportive therapy.   Interventions: Cognitive Behavioral Therapy and Insight-Oriented  Diagnosis:  F33.1  Plan of Care: Recommend ongoing therapy.  Pt participated in setting treatment goals.   Pt wants to improve coping skills and reduce angry outbursts.  Plan to meet monthly.   Pt agrees with treatment plan.    Treatment Plan Client Abilities/Strengths  Pt is engaging, and motivated for therapy.   Client Treatment Preferences  Individual therapy.  Client Statement of Needs  Improve coping skills.  Symptoms  Depressed or irritable mood. Agitation  Problems Addressed  Unipolar Depression Goals 1. Alleviate depressive symptoms and return to previous level of effective functioning. 2. Appropriately grieve the loss in order to normalize mood and to return to previously adaptive level of functioning. Objective Learn and implement behavioral strategies to overcome depression. Target Date: 2024-11-15 Frequency: Monthly  Progress: 30 Modality: individual  Related Interventions Engage the client in behavioral activation, increasing his/her activity level and contact with sources of reward, while identifying processes that inhibit activation.  Use behavioral techniques such as instruction, rehearsal, role-playing, role reversal, as needed, to facilitate activity in the client's daily life; reinforce success. Assist the client in developing skills that increase the  likelihood of deriving pleasure from behavioral activation (e.g., assertiveness skills, developing an exercise plan, less internal/more external focus, increased social involvement); reinforce success. Objective Identify important people in life, past and present, and describe the quality, good and poor, of those relationships. Target  Date: 2024-11-15 Frequency: Monthly  Progress: 30 Modality: individual  Related Interventions Conduct Interpersonal Therapy beginning with the assessment of the client's interpersonal inventory of important past and present relationships; develop a case formulation linking depression to grief, interpersonal role disputes, role transitions, and/or interpersonal deficits). Objective Learn and implement problem-solving and decision-making skills. Target Date: 2024-11-15 Frequency: Monthly  Progress: 30 Modality: individual  Related Interventions Conduct Problem-Solving Therapy using techniques such as psychoeducation, modeling, and role-playing to teach client problem-solving skills (i.e., defining a problem specifically, generating possible solutions, evaluating the pros and cons of each solution, selecting and implementing a plan of action, evaluating the efficacy of the plan, accepting or revising the plan); role-play application of the problem-solving skill to a real life issue. Encourage in the client the development of a positive problem orientation in which problems and solving them are viewed as a natural part of life and not something to be feared, despaired, or avoided. 3. Develop healthy interpersonal relationships that lead to the alleviation and help prevent the relapse of depression. 4. Develop healthy thinking patterns and beliefs about self, others, and the world that lead to the alleviation and help prevent the relapse of depression. 5. Recognize, accept, and cope with feelings of depression. Diagnosis F33.1  Conditions For Discharge Achievement of treatment goals and objectives   Veva Alma, LCSW

## 2024-04-07 ENCOUNTER — Ambulatory Visit: Admitting: Psychology

## 2024-04-07 DIAGNOSIS — F331 Major depressive disorder, recurrent, moderate: Secondary | ICD-10-CM | POA: Diagnosis not present

## 2024-04-07 NOTE — Progress Notes (Signed)
 "  [facility] Counselor/Therapist Progress Note  Patient ID: Lori Oneal, MRN: [id number],    Date: 04/07/2024  Time Spent: 4:00pm-4:45pm   45 minutes   Treatment Type: Individual Therapy  Reported Symptoms: stress, confusion  Mental Status Exam: Appearance:  Casual     Behavior: Appropriate  Motor: Normal  Speech/Language:  Normal Rate  Affect: Appropriate  Mood: normal  Thought process: normal  Thought content:   WNL  Sensory/Perceptual disturbances:   WNL  Orientation: oriented to person, place, time/date, and situation  Attention: Good  Concentration: Good  Memory: WNL  Fund of knowledge:  Good  Insight:   Good  Judgment:  Good  Impulse Control: Good   Risk Assessment: Danger to Self:  No Self-injurious Behavior: No Danger to Others: No Duty to Warn:no Physical Aggression / Violence:No  Access to Firearms a concern: No  Gang Involvement:No   Subjective: Pt present for face-to-face individual therapy via video.  Pt consents to telehealth video session and is aware of limitations and benefits of virtual sessions. Location of pt: home Location of therapist: home office.   Pt talked about doing better than she was last session.   She is trying to decrease her pot smoking.  She is also going to contact her medical provider about resuming taking Zoloft . Pt states she is still grieving the death of her ex-husband.   This has impacted who she feels in her relationship with her BF Marcey.   Pt postponed their wedding and Marcey is ok with that.  They still live together but pt states she does not feel like she belongs there.  It does not feel like her home.   Pt states she feels very confused and is over thinking things. Addressed how it is important for pt to not make any big decisions while she is so confused.  She states she will wait until she gets clarity and gets more grounded.    Recommended pt journal her feelings.  Pt states she has been reactive an says  things that hurts people's feelings.   Worked on emotional regulation.  Pt is still working two jobs and also stays busy with her kids and grandkids.    Worked on self care strategies. Provided supportive therapy.   Interventions: Cognitive Behavioral Therapy and Insight-Oriented  Diagnosis:  F33.1  Plan of Care: Recommend ongoing therapy.  Pt participated in setting treatment goals.   Pt wants to improve coping skills and reduce angry outbursts.  Plan to meet monthly.   Pt agrees with treatment plan.    Treatment Plan Client Abilities/Strengths  Pt is engaging, and motivated for therapy.   Client Treatment Preferences  Individual therapy.  Client Statement of Needs  Improve coping skills.  Symptoms  Depressed or irritable mood. Agitation  Problems Addressed  Unipolar Depression Goals 1. Alleviate depressive symptoms and return to previous level of effective functioning. 2. Appropriately grieve the loss in order to normalize mood and to return to previously adaptive level of functioning. Objective Learn and implement behavioral strategies to overcome depression. Target Date: 2024-11-15 Frequency: Monthly  Progress: 30 Modality: individual  Related Interventions Engage the client in behavioral activation, increasing his/her activity level and contact with sources of reward, while identifying processes that inhibit activation.  Use behavioral techniques such as instruction, rehearsal, role-playing, role reversal, as needed, to facilitate activity in the client's daily life; reinforce success. Assist the client in developing skills that increase the likelihood of deriving pleasure from behavioral activation (e.g.,  assertiveness skills, developing an exercise plan, less internal/more external focus, increased social involvement); reinforce success. Objective Identify important people in life, past and present, and describe the quality, good and poor, of those relationships. Target  Date: 2024-11-15 Frequency: Monthly  Progress: 30 Modality: individual  Related Interventions Conduct Interpersonal Therapy beginning with the assessment of the client's interpersonal inventory of important past and present relationships; develop a case formulation linking depression to grief, interpersonal role disputes, role transitions, and/or interpersonal deficits). Objective Learn and implement problem-solving and decision-making skills. Target Date: 2024-11-15 Frequency: Monthly  Progress: 30 Modality: individual  Related Interventions Conduct Problem-Solving Therapy using techniques such as psychoeducation, modeling, and role-playing to teach client problem-solving skills (i.e., defining a problem specifically, generating possible solutions, evaluating the pros and cons of each solution, selecting and implementing a plan of action, evaluating the efficacy of the plan, accepting or revising the plan); role-play application of the problem-solving skill to a real life issue. Encourage in the client the development of a positive problem orientation in which problems and solving them are viewed as a natural part of life and not something to be feared, despaired, or avoided. 3. Develop healthy interpersonal relationships that lead to the alleviation and help prevent the relapse of depression. 4. Develop healthy thinking patterns and beliefs about self, others, and the world that lead to the alleviation and help prevent the relapse of depression. 5. Recognize, accept, and cope with feelings of depression. Diagnosis F33.1  Conditions For Discharge Achievement of treatment goals and objectives   Veva Alma, LCSW   "

## 2024-05-19 ENCOUNTER — Ambulatory Visit: Admitting: Psychology

## 2024-06-16 ENCOUNTER — Ambulatory Visit: Admitting: Psychology
